# Patient Record
Sex: Female | Born: 1960 | Race: White | Hispanic: No | Marital: Married | Smoking: Current every day smoker
Health system: Southern US, Community
[De-identification: ages and names within clinical notes are randomized; demographics above are authoritative.]

## PROBLEM LIST (undated history)

## (undated) DIAGNOSIS — N951 Menopausal and female climacteric states: Secondary | ICD-10-CM

## (undated) HISTORY — PX: OTHER SURGICAL HISTORY: SHX169

## (undated) HISTORY — DX: Menopausal and female climacteric states: N95.1

---

## 2009-10-20 ENCOUNTER — Encounter: Payer: Self-pay | Admitting: Cardiology

## 2009-10-20 ENCOUNTER — Ambulatory Visit: Payer: Self-pay | Admitting: Family Medicine

## 2009-10-20 DIAGNOSIS — I499 Cardiac arrhythmia, unspecified: Secondary | ICD-10-CM | POA: Insufficient documentation

## 2009-10-20 DIAGNOSIS — I495 Sick sinus syndrome: Secondary | ICD-10-CM | POA: Insufficient documentation

## 2009-10-21 ENCOUNTER — Encounter: Payer: Self-pay | Admitting: Family Medicine

## 2009-10-23 LAB — CONVERTED CEMR LAB
ALT: 9 units/L (ref 0–35)
Albumin: 4.5 g/dL (ref 3.5–5.2)
Alkaline Phosphatase: 51 units/L (ref 39–117)
BUN: 13 mg/dL (ref 6–23)
CO2: 25 meq/L (ref 19–32)
Calcium: 8.8 mg/dL (ref 8.4–10.5)
Chloride: 106 meq/L (ref 96–112)
LDL Cholesterol: 126 mg/dL — ABNORMAL HIGH (ref 0–99)
Total Bilirubin: 0.7 mg/dL (ref 0.3–1.2)
Total CHOL/HDL Ratio: 3.3
Total Protein: 6.8 g/dL (ref 6.0–8.3)
Triglycerides: 56 mg/dL (ref <150)
VLDL: 11 mg/dL (ref 0–40)

## 2009-11-07 ENCOUNTER — Ambulatory Visit: Payer: Self-pay | Admitting: Family Medicine

## 2009-11-14 ENCOUNTER — Encounter: Admission: RE | Admit: 2009-11-14 | Discharge: 2009-11-14 | Payer: Self-pay | Admitting: Family Medicine

## 2009-11-16 DIAGNOSIS — R928 Other abnormal and inconclusive findings on diagnostic imaging of breast: Secondary | ICD-10-CM | POA: Insufficient documentation

## 2009-11-20 ENCOUNTER — Ambulatory Visit: Payer: Self-pay | Admitting: Family Medicine

## 2009-11-20 ENCOUNTER — Other Ambulatory Visit: Admission: RE | Admit: 2009-11-20 | Discharge: 2009-11-20 | Payer: Self-pay | Admitting: Family Medicine

## 2009-11-20 LAB — HM PAP SMEAR

## 2009-11-20 LAB — CONVERTED CEMR LAB: Pap Smear: NORMAL

## 2009-11-22 ENCOUNTER — Encounter: Admission: RE | Admit: 2009-11-22 | Discharge: 2009-11-22 | Payer: Self-pay | Admitting: Family Medicine

## 2009-11-22 LAB — HM MAMMOGRAPHY: HM Mammogram: NORMAL

## 2009-11-29 ENCOUNTER — Encounter: Payer: Self-pay | Admitting: Cardiology

## 2009-11-29 ENCOUNTER — Ambulatory Visit: Payer: Self-pay | Admitting: Cardiology

## 2009-11-29 DIAGNOSIS — F17200 Nicotine dependence, unspecified, uncomplicated: Secondary | ICD-10-CM

## 2009-11-29 DIAGNOSIS — R9431 Abnormal electrocardiogram [ECG] [EKG]: Secondary | ICD-10-CM | POA: Insufficient documentation

## 2009-12-14 ENCOUNTER — Encounter: Payer: Self-pay | Admitting: Cardiology

## 2009-12-14 ENCOUNTER — Ambulatory Visit: Payer: Self-pay | Admitting: Cardiology

## 2009-12-14 ENCOUNTER — Ambulatory Visit: Payer: Self-pay

## 2009-12-14 ENCOUNTER — Ambulatory Visit (HOSPITAL_COMMUNITY): Admission: RE | Admit: 2009-12-14 | Discharge: 2009-12-14 | Payer: Self-pay | Admitting: Cardiology

## 2009-12-26 ENCOUNTER — Ambulatory Visit: Payer: Self-pay | Admitting: Family Medicine

## 2009-12-26 DIAGNOSIS — IMO0001 Reserved for inherently not codable concepts without codable children: Secondary | ICD-10-CM | POA: Insufficient documentation

## 2009-12-27 ENCOUNTER — Encounter: Payer: Self-pay | Admitting: Family Medicine

## 2010-05-08 NOTE — Assessment & Plan Note (Signed)
Summary: NOV arrythmia   Vital Signs:  Patient profile:   50 year old female Height:      61 inches Weight:      114 pounds BMI:     21.62 O2 Sat:      99 % on Room air Pulse rate:   40 / minute BP sitting:   86 / 60  (left arm) Cuff size:   regular  Vitals Entered By: Payton Spark CMA (October 20, 2009 9:28 AM)  O2 Flow:  Room air CC: New to est.    Primary Care Provider:  Seymour Bars DO  CC:  New to est. .  History of Present Illness: 50 yo WF presents for NOV.  She went to [facility] years ago.  Denies any medical problems.  She has gained about 3 lbs and has been having heavier than normal periods recently.  She is having some hot flashes and nightsweats as she is just starting perimenopause.  She is a long time smoker.  Denies DOE, heart palpitations, fatigue, dizziness or presyncope.  She has never had any heart problems and takes NO RX or OTC meds.      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 Has 2 grown sons- local.  Smokes 1 ppd x 33 yrs. Denies ETOH. No exercise.  Review of Systems       no fevers/+sweats/weakness, unexplained +wt loss/gain, no change in vision, no difficulty hearing, ringing in ears, no hay fever/allergies, no CP/discomfort, no palpitations, no breast lump/nipple discharge, no cough/wheeze, no blood in stool,no  N/V/D, no nocturia, no leaking urine, + unusual vag bleeding, no vaginal/penile discharge, no muscle/joint pain, no rash, no new/changing mole, no HA, no memory loss, no anxiety, no sleep problem, no depression, no unexplained lumps, no easy bruising/bleeding, no concern with sexual function   Physical Exam  General:  alert, well-developed,  well-nourished, and well-hydrated.   Head:  normocephalic and atraumatic.   Eyes:  pupils equal, pupils round, and pupils reactive to light.   Mouth:  upper dentures poor lower dentition Neck:  no masses.   Lungs:  normal respiratory effort, no intercostal retractions, and no accessory muscle use.   Heart:  bradycardic with HR of 46 bpm, irreg rhythm with dropped beats Abdomen:  soft, non-tender, normal bowel sounds, and no distention.   Pulses:  2+ radial pulses Extremities:  no UE or LE edema Skin:  color normal.   Psych:  good eye contact, not anxious appearing, and not depressed appearing.     Impression & Recommendations:  Problem # 1:  CARDIAC ARRHYTHMIA (ICD-427.9) EKG done today for bradycardia, 40-->51 during exam.  She has PVCs with what appears to be a wandering pacer (different P waves).  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  Will check electrolytes, thyroid function today to r/o underlying cause.  Explained to pt that this needs to be addressed urgently and that if she has ANY symptoms of palpitations, dizziness, syncope, SOB, she will need to go the the ED.  Pt did not seem to clearly understand the urgency of this condition even after I explained that she  can die  from this.   Orders: T-Comprehensive Metabolic Panel [id number]) T-TSH ([id number])  Other Orders: T-Lipid Profile ([id number])  Patient Instructions: 1)  Labs today. 2)  I am faxing your abnormal EKG to [facility] cardiology to review. 3)  I will call you back today if you need to be hospitalized for this. 4)  Return for a PHYSICAL in 2 wks.  Appended Document: NOV arrythmia Pls let pt know that I spoke to Dr Rosalyn Charters nurse regarding her EKG and they feel she is OK to f/u as an outpt for this.  I will go ahead and set her up with Dr Jens Som downstairs.  Seymour Bars, D.O.  Appended Document: NOV arrythmia   Appended  Document: NOV arrythmia Pt aware of the above

## 2010-05-08 NOTE — Assessment & Plan Note (Signed)
Summary: infected bug bite   Vital Signs:  Patient profile:   50 year old female Menstrual status:  regular Height:      61 inches Weight:      113 pounds BMI:     21.43 O2 Sat:      98 % on Room air Pulse rate:   71 / minute BP sitting:   100 / 45  (left arm) Cuff size:   small  Vitals Entered By: Payton Spark CMA (December 26, 2009 3:54 PM)  O2 Flow:  Room air CC: L lower leg w/ insect bite x 4 days ago.    Primary Care Provider:  Seymour Bars DO  CC:  L lower leg w/ insect bite x 4 days ago. Marland Kitchen  History of Present Illness: 50 yo WF presents for a bug bite that she first noticed on SAT, 3 days ago.  She started stratching it.  It has been pruritis and a bit tender with rednes and swelling.  No pus or bleeding.  No fevers or chills.      Current Medications (verified): 1)  Patient Does Not Take Any Medications  Allergies (verified): No Known Drug Allergies  Past History:  Past Medical History: Reviewed history from 11/29/2009 and no changes required. MAMMOGRAM, ABNORMAL, LEFT  perimenopause  Social History: Reviewed history from 10/20/2009 and no changes required. 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  Smokes 1 ppd x 33 yrs. Denies ETOH. No exercise.  Review of Systems      See HPI  Physical Exam  General:  alert, well-developed, well-nourished, and well-hydrated.   Skin:  erythematous, localized edema and warmth with a puncture scab in the center over the lateral L calf, tender.  No drainaged.  Cleaned with betadine and alcohol.  Lifted the scab with TB syringe to obtain cx but not much drained.   Impression & Recommendations:  Problem # 1:  INSECT BITE, INFECTED (ICD-919.5) 3 days of an erythematous, painful infected insect lesion over the L calf. Will f/u cutlure results and empirically start on Minocycline x 10 days. Call if any sign of worsening after 48 hrs on abx. Clean with soap and water.  Keep covered.   Possible MRSA. Orders: T-Culture, Wound ([id number])  Complete Medication List: 1)  Patient Does Not Take Any Medications  2)  Minocycline Hcl 100 Mg Caps (Minocycline hcl) .Marland Kitchen.. 1 capsule by mouth two times a day x 10 days  Patient Instructions: 1)  Start on Minocycline 2 x a day, take with food. 2)  Clean infected bug bite with dial soap and water 2 x a day. 3)  Keep covered. 4)  Will call you with culture results this wk. 5)  Call if fevers (temp >101) or spreading redness / pain after 48 hrs. 6)  Recheck in 10 days if not completeley healed. Prescriptions: MINOCYCLINE HCL 100 MG CAPS (MINOCYCLINE HCL) 1 capsule by mouth two times a day x 10 days  #20 x 0   Entered and Authorized by:   Seymour Bars DO   Signed by:   Seymour Bars DO on 12/26/2009   Method used:   Electronically to        [facility] [phone]* (retail)       [address] [place], [place]  [address]       Ph: [phone]       Fax: [phone]   RxID:   [id number]

## 2010-05-08 NOTE — Assessment & Plan Note (Signed)
Summary: [facility]   Visit Type:  Initial Consult Primary Provider:  Seymour Bars DO  CC:  Bradycardia.  History of Present Illness: 50 year old female of abnormal electrocardiogram. Patient has no prior cardiac history. She does not have dyspnea on exertion, orthopnea, PND, pedal edema, palpitations or chest pain or history of syncope. Was recently seen for a routine physical examination and noted to be bradycardic. An electrocardiogram was performed on October 20, 2009. This showed sinus bradycardia with occasional PACs and PVCs. There were nonspecific ST changes. Because of the above we were asked to further evaluate. Note a TSH, renal function and potassium were normal.  Current Medications (verified): 1)  Patient Does Not Take Any Medications  Allergies (verified): No Known Drug Allergies  Past History:  Past Medical History: MAMMOGRAM, ABNORMAL, LEFT  perimenopause  Past Surgical History: Reviewed history from 10/20/2009 and no changes required. LCTS x 2 BTL  Family History: Reviewed history from 10/20/2009 and no changes required. mother-died with COPD, hip fx father HTN sister and 3 brothers healthy No premature CAD  Social History: Reviewed history from 10/20/2009 and no changes required. 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  Smokes 1 ppd x 33 yrs. Denies ETOH. No exercise.  Review of Systems       no fevers or chills, productive cough, hemoptysis, dysphasia, odynophagia, melena, hematochezia, dysuria, hematuria, rash, seizure activity, orthopnea, PND, pedal edema, claudication. Remaining systems are negative.   Vital Signs:  Patient profile:   50 year old female Menstrual status:  regular Height:      61 inches Weight:      112 pounds BMI:     21.24 Pulse rate:   64 / minute Pulse rhythm:   regular Resp:     18 per minute BP sitting:   89 / 51  (left arm) Cuff size:   small  Vitals Entered By: Vikki Ports  (November 29, 2009 2:05 PM)  Physical Exam  General:  Well developed/well nourished in NAD Skin warm/dry Patient not depressed No peripheral clubbing Back-normal HEENT-normal/normal eyelids Neck supple/normal carotid upstroke bilaterally; no bruits; no JVD; no thyromegaly chest - CTA/ normal expansion CV - RRR/normal S1 and S2; no murmurs, rubs or gallops;  PMI nondisplaced Abdomen -NT/ND, no HSM, no mass, + bowel sounds, no bruit 2+ femoral pulses, no bruits Ext-no edema, chords, 2+ DP; mild varicosities Neuro-grossly nonfocal     Impression & Recommendations:  Problem # 1:  ABNORMAL ELECTROCARDIOGRAM (ICD-794.31) I have reviewed the patient's electrocardiogram from October 20, 2009. This showed a marked sinus bradycardia with PACs most likely from a low atrial focus. There were also occasional PVCs and nonspecific ST changes. There is no history of syncope, palpitations or fatigue. She has no cardiac symptoms. I will schedule an echocardiogram to quantify LV function. Note a TSH and potassium were normal. If her echo shows normal LV function I do not think further cardiac workup is indicated.  Problem # 2:  TOBACCO ABUSE (ICD-305.1) Patient counseled on discontinuing for between 3-10 minutes.  Other Orders: Echocardiogram (Echo)  Patient Instructions: 1)  Your physician recommends that you schedule a follow-up appointment in: AS NEEDED PENDING TEST RESULTS 2)  Your physician has requested that you have an echocardiogram.  Echocardiography is a painless test that uses sound waves to create images of your heart. It provides your doctor with information about the size and shape of your heart and how well your heart's chambers and valves are working.  This procedure takes approximately one hour. There are no restrictions for this procedure.

## 2010-05-08 NOTE — Assessment & Plan Note (Signed)
Summary: pap only   Vital Signs:  Patient profile:   50 year old female Menstrual status:  regular Height:      61 inches Weight:      114 pounds BMI:     21.62 O2 Sat:      98 % on Room air Pulse rate:   46 / minute BP sitting:   103 / 57  (left arm) Cuff size:   regular  Vitals Entered By: Payton Spark CMA (November 20, 2009 4:00 PM)  O2 Flow:  Room air CC: Pap only   Primary Care Provider:  Seymour Bars DO  CC:  Pap only.  History of Present Illness: 50 yo WF due for pap only.  Had CPE this month but was on her period.  No hx of abnormal paps.    Allergies: No Known Drug Allergies  Physical Exam  Genitalia:  Pelvic Exam:        External: normal female genitalia without lesions or masses        Vagina: normal without lesions or masses        Cervix: normal without lesions or masses        Adnexa: normal bimanual exam without masses or fullness        Uterus: normal by palpation        Pap smear: performed   Impression & Recommendations:  Problem # 1:  SCREENING FOR MALIGNANT NEOPLASM OF THE CERVIX (ICD-V76.2) Thin prep pap done.   F/U results later this wk. She is scheduled for a f/u diagnostic mammogram.  Order was printed last wk.

## 2010-05-08 NOTE — Assessment & Plan Note (Signed)
Summary: cpe-jr   Vital Signs:  Patient profile:   50 year old female Menstrual status:  regular LMP:     11/04/2009 Height:      61 inches Weight:      113 pounds BMI:     21.43 O2 Sat:      96 % on Room air Pulse rate:   63 / minute BP sitting:   88 / 48  (left arm) Cuff size:   regular  Vitals Entered By: Payton Spark CMA (November 07, 2009 3:19 PM)  O2 Flow:  Room air CC: CPE w/out pap LMP (date): 11/04/2009     Menstrual Status regular Enter LMP: 11/04/2009   Primary Care Christina Wang:  Seymour Bars DO  CC:  CPE w/out pap.  History of Present Illness: 50 yo WF presents for CPE without pap smear.  She is G2P2002.  Perimenopausal.  REmarried.  On her period today so needs to reschedule her pap smear.  REcently had labs drawn, all normal.  Has low BP but is asymptomatic.  Physically active at work.  Smokes but is not interested in quitting.  Unsure of her last tetanus vaccine.  Pt claims to have had a colonscopy but is not sure why she had it prior to age 65.      Current Medications (verified): 1)  None  Allergies (verified): No Known Drug Allergies  Past History:  Past Medical History: smoker perimenopause  Past Surgical History: Reviewed history from 10/20/2009 and no changes required. LCTS x 2 BTL  Family History: Reviewed history from 10/20/2009 and no changes required. mother-died with COPD, hip fx father HTN sister and 3 brothers healthy  Social History: Reviewed history from 10/20/2009 and no changes required. 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  Smokes 1 ppd x 33 yrs. Denies ETOH. No exercise.  Review of Systems  The patient denies anorexia, fever, weight loss, weight gain, vision loss, decreased hearing, hoarseness, chest pain, syncope, dyspnea on exertion, peripheral edema, prolonged cough, headaches, hemoptysis, abdominal pain, melena, hematochezia, severe indigestion/heartburn, hematuria, incontinence,  genital sores, muscle weakness, suspicious skin lesions, transient blindness, difficulty walking, depression, unusual weight change, abnormal bleeding, enlarged lymph nodes, angioedema, breast masses, and testicular masses.    Physical Exam  General:  alert, well-developed, well-nourished, and well-hydrated.   Head:  normocephalic and atraumatic.   Eyes:  pupils equal, pupils round, and pupils reactive to light.   Ears:  EACs patent; TMs translucent and gray with good cone of light and bony landmarks.  Nose:  no nasal discharge.   Mouth:  dentures on top, fair dentition lower; pharynx pink and moist.   Neck:  no masses.   Breasts:  No mass, nodules, thickening, tenderness, bulging, retraction, inflamation, nipple discharge or skin changes noted.   Lungs:  Normal respiratory effort, chest expands symmetrically. Lungs are clear to auscultation, no crackles or wheezes. Heart:  Normal rate and regular rhythm. S1 and S2 normal without gallop, murmur, click, rub or other extra sounds. Abdomen:  soft, non-tender, normal bowel sounds, no distention, no masses, no guarding, no hepatomegaly, and no splenomegaly.   Msk:  no joint tenderness, no joint swelling, and no joint warmth.   Pulses:  2+ radial and pedal pulses Extremities:  no LE edema Skin:  color normal and no suspicious lesions.   Cervical Nodes:  No lymphadenopathy noted Psych:  good eye contact, not anxious appearing, and not depressed appearing.     Impression & Recommendations:  Problem # 1:  PHYSICAL EXAMINATION (ICD-V70.0) Keeping healthy checklist for women reviewed. Asymptomatic hypotension, possibly from dehydration -- works in a warehouse and sweats a lot. Discussed increasing fluids and salt intake, rehcekc in 3 mos. Tdap unknown. Colonoscopy due at 52. Update mammogram. RTC in 2 wks for pap only. Labs reviewed, normal 10-2009. MVI, smoking cessation, Calcium with D and healthy diet reviewed.  Other  Orders: T-Mammography Bilateral Screening ([id number])  Patient Instructions: 1)  Return for a nurse visit in 3 mos (Flu shot, BP recheck). 2)  Update mammogram.

## 2010-07-06 ENCOUNTER — Telehealth: Payer: Self-pay | Admitting: *Deleted

## 2010-07-06 NOTE — Telephone Encounter (Signed)
Pls let pt know that we cannot send in RX pain meds w/o an office visit.  I would advise her to take Aleve 2 tabs with breakfast and 2 tabs with dinner until she comes in to see me.  This is = to RX Naproxen.  If pain is too severe, she can go to UC.

## 2010-07-06 NOTE — Telephone Encounter (Signed)
Pt has apt next week for neck pain bc she couldn't get in this week. Pt requests Rx to start for pain before apt bc she is in pain. Please advise.

## 2010-07-06 NOTE — Telephone Encounter (Signed)
LMOM informing Pt of the above 

## 2010-07-19 ENCOUNTER — Encounter: Payer: Self-pay | Admitting: Family Medicine

## 2010-07-20 ENCOUNTER — Encounter: Payer: Self-pay | Admitting: Family Medicine

## 2010-07-20 ENCOUNTER — Ambulatory Visit (INDEPENDENT_AMBULATORY_CARE_PROVIDER_SITE_OTHER): Payer: Self-pay | Admitting: Family Medicine

## 2010-07-20 VITALS — BP 97/53 | HR 64 | Ht 61.0 in | Wt 115.0 lb

## 2010-07-20 DIAGNOSIS — G5600 Carpal tunnel syndrome, unspecified upper limb: Secondary | ICD-10-CM

## 2010-07-20 DIAGNOSIS — M542 Cervicalgia: Secondary | ICD-10-CM | POA: Insufficient documentation

## 2010-07-20 MED ORDER — MELOXICAM 15 MG PO TABS: 15.0000 mg | ORAL_TABLET | Freq: Every day | ORAL | Status: DC

## 2010-07-20 NOTE — Assessment & Plan Note (Signed)
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  Will do home PT (h/o given), heat and daily meloxicam with dinner to start.  If not imprving, will add PT

## 2010-07-20 NOTE — Progress Notes (Signed)
  Subjective:    Patient ID: Christina Wang, female    DOB: 10/11/1960, 50 y.o.   MRN: [id number]  HPI  50 yo WF presents for pain in her neck that is worse on the L side, worse at the end of the day and worse at the end of the week.  It started bothering her a few months ago.  She does repetitive line work.  She is having more limitation in her neck ROM with flexion.  She tried ibuprofen which helped some.  Her neck hurts when she tries to get comfortable at night.  No radiation into her arms but has some tingling in her hands.  She has never had imaging done and denies any hx of whiplash injury.    BP 97/53  Pulse 64  Ht 5\' 1"  (1.549 m)  Wt 115 lb (52.164 kg)  BMI 21.73 kg/m2  SpO2 98%  LMP 06/07/2010    Review of Systems  Musculoskeletal: Positive for myalgias. Negative for back pain.  Neurological: Positive for numbness (nubmness in both hands, chronic). Negative for tremors, weakness and headaches.       Objective:   Physical Exam  Constitutional: She appears well-developed and well-nourished.  HENT:  Head: Normocephalic and atraumatic.  Neck: Normal range of motion. Neck supple. No thyromegaly present.       Tender, tight trapezious muscle esp over the L lateral neck  Cardiovascular: Normal rate, regular rhythm and normal heart sounds.   No murmur heard. Pulmonary/Chest: Effort normal and breath sounds normal.  Musculoskeletal: She exhibits no edema.       Grip + 5/5  Skin: Skin is warm and dry.  Psychiatric: She has a normal mood and affect.          Assessment & Plan:

## 2010-07-20 NOTE — Assessment & Plan Note (Signed)
Likely, chronic.  Started way before neck pain started.  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  Already on meloxicam.  No loss of strength.

## 2010-07-20 NOTE — Patient Instructions (Signed)
Referral made to ortho downstairs for probable bilateral carpal tunnel.  After work, use a heating pad (hot shower or shot towel) for 10 min followed by neck stretches and take 1 tab of meloxicam with dinner.

## 2010-08-09 ENCOUNTER — Telehealth: Payer: Self-pay | Admitting: Family Medicine

## 2010-08-09 NOTE — Telephone Encounter (Signed)
Pt called and needs an order for nerve test to be done at [facility].  Originally saw Dr. Orlan Leavens last week.  To go on 08-14-10 for test. Plan:  Routed to Dr. Arlice Colt, LPN [facility]

## 2010-10-22 ENCOUNTER — Other Ambulatory Visit: Payer: Self-pay | Admitting: Family Medicine

## 2010-10-22 DIAGNOSIS — Z1231 Encounter for screening mammogram for malignant neoplasm of breast: Secondary | ICD-10-CM

## 2010-11-26 ENCOUNTER — Ambulatory Visit: Payer: Commercial Indemnity

## 2010-11-27 ENCOUNTER — Ambulatory Visit
Admission: RE | Admit: 2010-11-27 | Discharge: 2010-11-27 | Disposition: A | Discharge status: Home / Self Care | Payer: Commercial Indemnity | Source: Ambulatory Visit | Attending: Family Medicine | Admitting: Family Medicine

## 2010-11-27 DIAGNOSIS — Z1231 Encounter for screening mammogram for malignant neoplasm of breast: Secondary | ICD-10-CM

## 2011-04-11 ENCOUNTER — Ambulatory Visit (INDEPENDENT_AMBULATORY_CARE_PROVIDER_SITE_OTHER): Payer: Commercial Indemnity | Admitting: Physician Assistant

## 2011-04-11 ENCOUNTER — Encounter: Payer: Self-pay | Admitting: Physician Assistant

## 2011-04-11 ENCOUNTER — Other Ambulatory Visit: Payer: Self-pay | Admitting: Physician Assistant

## 2011-04-11 ENCOUNTER — Other Ambulatory Visit: Payer: Self-pay | Admitting: *Deleted

## 2011-04-11 ENCOUNTER — Ambulatory Visit: Payer: Commercial Indemnity | Admitting: Family Medicine

## 2011-04-11 VITALS — BP 107/61 | HR 80 | Temp 97.9°F | Ht 60.0 in | Wt 111.0 lb

## 2011-04-11 DIAGNOSIS — L03319 Cellulitis of trunk, unspecified: Secondary | ICD-10-CM

## 2011-04-11 DIAGNOSIS — L02212 Cutaneous abscess of back [any part, except buttock]: Secondary | ICD-10-CM

## 2011-04-11 MED ORDER — MELOXICAM 15 MG PO TABS: 15.0000 mg | ORAL_TABLET | Freq: Every day | ORAL | Status: AC

## 2011-04-11 NOTE — Patient Instructions (Addendum)
Gave handout. Follow up in 1 week. Instructed patient when receive culture antibiotic might be prescribed. Symptomatic Care.  Abscess An abscess (boil or furuncle) is an infected area that contains a collection of pus.  SYMPTOMS Signs and symptoms of an abscess include pain, tenderness, redness, or hardness. You may feel a moveable soft area under your skin. An abscess can occur anywhere in the body.  TREATMENT  A surgical cut (incision) may be made over your abscess to drain the pus. Gauze may be packed into the space or a drain may be looped through the abscess cavity (pocket). This provides a drain that will allow the cavity to heal from the inside outwards. The abscess may be painful for a few days, but should feel much better if it was drained.  Your abscess, if seen early, may not have localized and may not have been drained. If not, another appointment may be required if it does not get better on its own or with medications. HOME CARE INSTRUCTIONS   Only take over-the-counter or prescription medicines for pain, discomfort, or fever as directed by your caregiver.   Take your antibiotics as directed if they were prescribed. Finish them even if you start to feel better.   Keep the skin and clothes clean around your abscess.   If the abscess was drained, you will need to use gauze dressing to collect any draining pus. Dressings will typically need to be changed 3 or more times a day.   The infection may spread by skin contact with others. Avoid skin contact as much as possible.   Practice good hygiene. This includes regular hand washing, cover any draining skin lesions, and do not share personal care items.   If you participate in sports, do not share athletic equipment, towels, whirlpools, or personal care items. Shower after every practice or tournament.   If a draining area cannot be adequately covered:   Do not participate in sports.   Children should not participate in day care  until the wound has healed or drainage stops.   If your caregiver has given you a follow-up appointment, it is very important to keep that appointment. Not keeping the appointment could result in a much worse infection, chronic or permanent injury, pain, and disability. If there is any problem keeping the appointment, you must call back to this facility for assistance.  SEEK MEDICAL CARE IF:   You develop increased pain, swelling, redness, drainage, or bleeding in the wound site.   You develop signs of generalized infection including muscle aches, chills, fever, or a general ill feeling.   You have an oral temperature above 102 F (38.9 C).  MAKE SURE YOU:   Understand these instructions.   Will watch your condition.   Will get help right away if you are not doing well or get worse.  Document Released: 01/02/2005 Document Revised: 12/05/2010 Document Reviewed: 10/27/2007 [facility] Patient Information 2012 [place], [place].

## 2011-04-11 NOTE — Progress Notes (Signed)
  Subjective:    Patient ID: Christina Wang, female    DOB: [birth year], 51 y.o.   MRN: [id number]  HPI Patient has a history of boils on other areas of body. She has not had one in a couple of years. Patient reports she noticed a red bump on her back about 1 week ago. It has gradually grown until it has become painful and she can't lay on that side. She denies any fever of systemic symptoms. She reports it has drained some but not a lot.   Review of Systems  Constitutional: Negative for fever, chills, diaphoresis, activity change, appetite change, fatigue and unexpected weight change.  Skin: Positive for wound.       Objective:   Physical Exam  Constitutional: She is oriented to person, place, and time. She appears well-developed and well-nourished. No distress.  HENT:  Head: Normocephalic and atraumatic.  Neurological: She is alert and oriented to person, place, and time.  Skin: She is not diaphoretic. There is erythema.       4 cm abscess with area of erythema and center scab with some purulent drainage on the right upper back on the shoulder blade  Psychiatric: She has a normal mood and affect. Her behavior is normal.          Assessment & Plan:  Abscess of the upper right back- Gave handout on symptomatic care. Follow-up in 1 week. Culture obtained.  Incision and Drainage Procedure Note  Pre-operative Diagnosis: abscess   Post-operative Diagnosis: same  Indications: infection  Anesthesia: 1% plain lidocaine  Procedure Details  The procedure, risks and complications have been discussed in detail (including, but not limited to airway compromise, infection, bleeding) with the patient, and the patient has signed consent to the procedure.  The skin was sterilely prepped and draped over the affected area in the usual fashion. After adequate local anesthesia, I&D with a #11 blade was performed on the right upper back on top of shoulder blade. Purulent drainage: present The  patient was observed until stable.  Findings: After incision made purulent pus was observed and obtain for culture. Pus, blood, and inflammatory tissue was removed. Abscess packed with directions on home care.  EBL: .5 cc's  Drains: none  Condition: Tolerated procedure well   Complications: none.

## 2011-04-17 LAB — WOUND CULTURE: Gram Stain: NONE SEEN

## 2011-04-19 ENCOUNTER — Encounter: Payer: Self-pay | Admitting: Physician Assistant

## 2011-04-19 ENCOUNTER — Ambulatory Visit (INDEPENDENT_AMBULATORY_CARE_PROVIDER_SITE_OTHER): Payer: Commercial Indemnity | Admitting: Physician Assistant

## 2011-04-19 VITALS — BP 97/57 | HR 71 | Ht 60.0 in | Wt 112.0 lb

## 2011-04-19 DIAGNOSIS — L02212 Cutaneous abscess of back [any part, except buttock]: Secondary | ICD-10-CM

## 2011-04-19 DIAGNOSIS — L02219 Cutaneous abscess of trunk, unspecified: Secondary | ICD-10-CM

## 2011-04-19 MED ORDER — CLINDAMYCIN HCL 150 MG PO CAPS: 300.0000 mg | ORAL_CAPSULE | Freq: Three times a day (TID) | ORAL | Status: AC

## 2011-04-19 NOTE — Progress Notes (Signed)
  Subjective:    Patient ID: Christina Wang, female    DOB: 06/02/1960, 52 y.o.   MRN: [id number]  HPI Patient reports all of the packing has been taken out. Abscess remains to drain and she changes bandages 3-4 times a day. Abscess is much less tender. Denies any fever, chills, or nausea and vomiting.     Review of Systems     Objective:   Physical Exam  Skin:       Abscess of the right upper back has decreased in size. It still has white/yellow drainage. Area of erythema is minimal, 1mm around abscess.           Assessment & Plan:  Abscess of upper right back- Redressed wound today. Would appears to be healing but still has a modest amount of purulent drainage. Continue to change dressing 3-4 times a day or as needed. Clindamycin was prescribed due to culture testing positive for MRSA. Follow up if not resolved in 2 weeks.

## 2011-04-19 NOTE — Patient Instructions (Signed)
Start Clindamycin for MRSA. Continue to change dressing 3-4 times a day or as needed due to drainage. Follow-up if not resolved in 2 weeks.

## 2011-10-23 ENCOUNTER — Other Ambulatory Visit: Payer: Self-pay | Admitting: Family Medicine

## 2011-10-23 DIAGNOSIS — Z1231 Encounter for screening mammogram for malignant neoplasm of breast: Secondary | ICD-10-CM

## 2011-12-03 ENCOUNTER — Ambulatory Visit (INDEPENDENT_AMBULATORY_CARE_PROVIDER_SITE_OTHER): Payer: Commercial Indemnity

## 2011-12-03 DIAGNOSIS — Z1231 Encounter for screening mammogram for malignant neoplasm of breast: Secondary | ICD-10-CM

## 2012-05-19 ENCOUNTER — Ambulatory Visit: Payer: Commercial Indemnity

## 2012-05-27 ENCOUNTER — Ambulatory Visit (INDEPENDENT_AMBULATORY_CARE_PROVIDER_SITE_OTHER): Payer: Commercial Indemnity | Admitting: Family Medicine

## 2012-05-27 ENCOUNTER — Encounter: Payer: Self-pay | Admitting: Family Medicine

## 2012-05-27 VITALS — BP 98/60 | Temp 97.7°F | Ht 60.0 in | Wt 118.0 lb

## 2012-05-27 DIAGNOSIS — B9689 Other specified bacterial agents as the cause of diseases classified elsewhere: Secondary | ICD-10-CM

## 2012-05-27 DIAGNOSIS — A499 Bacterial infection, unspecified: Secondary | ICD-10-CM

## 2012-05-27 MED ORDER — AZITHROMYCIN 250 MG PO TABS: ORAL_TABLET | ORAL | Status: AC

## 2012-05-27 NOTE — Progress Notes (Signed)
CC: Christina Wang is a 52 y.o. female is here for Nasal Congestion   Subjective: HPI:  Patient complains of a cough that started 8 days ago. Initially productive and mild was gone for 2 days and then came back suddenly moderate in severity. It has been persistent and unchanging since. It is present all hours of the day but seems to be worse in the evening. It often interferes with her sleep. It is described as productive of brown sputum. No blood in the sputum. She complains of chest congestion but no pain per se, and denies exertional chest pain. She had subjective fevers last night. No interventions as of yet. Associated with clear nasal discharge with pressure in the forehead that is worse when leaning forward but this is mild at the most. She denies chills, confusion, motor sensory disturbances, sore throat, hearing loss, shortness of breath, wheezing, confusion, dysphagia.    Review Of Systems Outlined In HPI  Past Medical History  Diagnosis Date  . Perimenopause      Family History  Problem Relation Age of Onset  . COPD Mother   . Hip fracture Mother   . Hypertension Father   . Coronary artery disease Neg Hx      History  Substance Use Topics  . Smoking status: Current Every Day Smoker -- 1.00 packs/day for 33 years  . Smokeless tobacco: Not on file  . Alcohol Use: No     Objective: Filed Vitals:   05/27/12 1531  BP: 98/60  Temp: 97.7 F (36.5 C)    General: Alert and Oriented, No Acute Distress HEENT: Pupils equal, round, reactive to light. Conjunctivae clear.  External ears unremarkable, canals clear with intact TMs with appropriate landmarks.  Middle ear appears open without effusion. Pink inferior turbinates.  Moist mucous membranes, pharynx without inflammation nor lesions.  Neck supple without palpable lymphadenopathy nor abnormal masses. Lungs: Moderate central rhonchi without wheezing nor rales. No signs of consolidation. Comfortable work of  breathing. Cardiac: Regular rate and rhythm. Normal S1/S2.  No murmurs, rubs, nor gallops.   Extremities: No peripheral edema.  Strong peripheral pulses.  Mental Status: No depression, anxiety, nor agitation. Skin: Warm and dry.  Assessment & Plan: Christina Wang was seen today for nasal congestion.  Diagnoses and associated orders for this visit:  Acute bacterial bronchitis - azithromycin (ZITHROMAX) 250 MG tablet; Take two tabs at once on day 1, then one tab daily on days 2-5.    Given current tobacco use and persistence of symptoms we'll start azithromycin. Discussed use of DayQuil and or NyQuil at appropriate times a day for symptoms control.Signs and symptoms requring emergent/urgent reevaluation were discussed with the patient.  Return in about 4 weeks (around 06/24/2012), or if symptoms worsen or fail to improve.

## 2012-10-26 ENCOUNTER — Other Ambulatory Visit: Payer: Self-pay | Admitting: Family Medicine

## 2012-10-26 DIAGNOSIS — Z139 Encounter for screening, unspecified: Secondary | ICD-10-CM

## 2012-12-03 ENCOUNTER — Ambulatory Visit (INDEPENDENT_AMBULATORY_CARE_PROVIDER_SITE_OTHER): Payer: Commercial Indemnity

## 2012-12-03 DIAGNOSIS — R928 Other abnormal and inconclusive findings on diagnostic imaging of breast: Secondary | ICD-10-CM

## 2012-12-03 DIAGNOSIS — Z1231 Encounter for screening mammogram for malignant neoplasm of breast: Secondary | ICD-10-CM

## 2012-12-03 DIAGNOSIS — Z139 Encounter for screening, unspecified: Secondary | ICD-10-CM

## 2012-12-08 ENCOUNTER — Other Ambulatory Visit: Payer: Self-pay | Admitting: Family Medicine

## 2012-12-08 DIAGNOSIS — R928 Other abnormal and inconclusive findings on diagnostic imaging of breast: Secondary | ICD-10-CM

## 2012-12-25 ENCOUNTER — Ambulatory Visit
Admission: RE | Admit: 2012-12-25 | Discharge: 2012-12-25 | Disposition: A | Discharge status: Home / Self Care | Payer: Commercial Indemnity | Source: Ambulatory Visit | Attending: Family Medicine | Admitting: Family Medicine

## 2012-12-25 DIAGNOSIS — R928 Other abnormal and inconclusive findings on diagnostic imaging of breast: Secondary | ICD-10-CM

## 2013-11-22 ENCOUNTER — Other Ambulatory Visit: Payer: Self-pay | Admitting: Family Medicine

## 2013-11-22 DIAGNOSIS — Z1231 Encounter for screening mammogram for malignant neoplasm of breast: Secondary | ICD-10-CM

## 2013-12-07 ENCOUNTER — Ambulatory Visit (INDEPENDENT_AMBULATORY_CARE_PROVIDER_SITE_OTHER): Payer: Commercial Indemnity

## 2013-12-07 DIAGNOSIS — Z1231 Encounter for screening mammogram for malignant neoplasm of breast: Secondary | ICD-10-CM

## 2014-11-08 ENCOUNTER — Other Ambulatory Visit: Payer: Self-pay | Admitting: Family Medicine

## 2014-11-08 DIAGNOSIS — Z1231 Encounter for screening mammogram for malignant neoplasm of breast: Secondary | ICD-10-CM

## 2014-12-13 ENCOUNTER — Ambulatory Visit (INDEPENDENT_AMBULATORY_CARE_PROVIDER_SITE_OTHER): Payer: Commercial Indemnity

## 2014-12-13 DIAGNOSIS — R928 Other abnormal and inconclusive findings on diagnostic imaging of breast: Secondary | ICD-10-CM

## 2014-12-13 DIAGNOSIS — Z1231 Encounter for screening mammogram for malignant neoplasm of breast: Secondary | ICD-10-CM

## 2014-12-14 ENCOUNTER — Other Ambulatory Visit: Payer: Self-pay | Admitting: Family Medicine

## 2014-12-14 ENCOUNTER — Ambulatory Visit: Payer: Commercial Indemnity

## 2014-12-14 DIAGNOSIS — R928 Other abnormal and inconclusive findings on diagnostic imaging of breast: Secondary | ICD-10-CM

## 2014-12-23 ENCOUNTER — Ambulatory Visit
Admission: RE | Admit: 2014-12-23 | Discharge: 2014-12-23 | Disposition: A | Discharge status: Home / Self Care | Payer: Managed Care, Other (non HMO) | Source: Ambulatory Visit | Attending: Family Medicine | Admitting: Family Medicine

## 2014-12-23 DIAGNOSIS — R928 Other abnormal and inconclusive findings on diagnostic imaging of breast: Secondary | ICD-10-CM

## 2015-11-20 ENCOUNTER — Other Ambulatory Visit: Payer: Self-pay | Admitting: Family Medicine

## 2015-11-20 DIAGNOSIS — Z1231 Encounter for screening mammogram for malignant neoplasm of breast: Secondary | ICD-10-CM

## 2015-12-27 ENCOUNTER — Ambulatory Visit (INDEPENDENT_AMBULATORY_CARE_PROVIDER_SITE_OTHER): Payer: Managed Care, Other (non HMO)

## 2015-12-27 DIAGNOSIS — Z1231 Encounter for screening mammogram for malignant neoplasm of breast: Secondary | ICD-10-CM

## 2016-01-01 ENCOUNTER — Other Ambulatory Visit: Payer: Self-pay | Admitting: Family Medicine

## 2016-01-01 DIAGNOSIS — R928 Other abnormal and inconclusive findings on diagnostic imaging of breast: Secondary | ICD-10-CM

## 2016-01-03 ENCOUNTER — Telehealth: Payer: Self-pay | Admitting: Family Medicine

## 2016-01-03 NOTE — Telephone Encounter (Signed)
Pleas tell her I apologize. That was my fault.  She really does need further evaluation.

## 2016-01-03 NOTE — Telephone Encounter (Signed)
Pt advised, she has scheduled with the breast center.

## 2016-01-03 NOTE — Telephone Encounter (Signed)
Received call from [facility]- Breast center. They attempted to contact Pt to schedule US and diagnostic mammo, Pt declined stating she was advised last mammo was normal. Per PCP note, that is what Pt was advised. The breast center would like PCP to review latest mammo again for recommendation.

## 2016-01-12 ENCOUNTER — Other Ambulatory Visit: Payer: Managed Care, Other (non HMO)

## 2016-01-22 ENCOUNTER — Encounter: Payer: Self-pay | Admitting: Family Medicine

## 2016-01-22 ENCOUNTER — Ambulatory Visit
Admission: RE | Admit: 2016-01-22 | Discharge: 2016-01-22 | Disposition: A | Discharge status: Home / Self Care | Payer: Managed Care, Other (non HMO) | Source: Ambulatory Visit | Attending: Family Medicine | Admitting: Family Medicine

## 2016-01-22 ENCOUNTER — Ambulatory Visit (INDEPENDENT_AMBULATORY_CARE_PROVIDER_SITE_OTHER): Payer: Managed Care, Other (non HMO) | Admitting: Family Medicine

## 2016-01-22 VITALS — BP 105/54 | HR 64 | Ht 60.0 in | Wt 124.0 lb

## 2016-01-22 DIAGNOSIS — N200 Calculus of kidney: Secondary | ICD-10-CM

## 2016-01-22 DIAGNOSIS — Z23 Encounter for immunization: Secondary | ICD-10-CM

## 2016-01-22 DIAGNOSIS — K5904 Chronic idiopathic constipation: Secondary | ICD-10-CM | POA: Diagnosis not present

## 2016-01-22 DIAGNOSIS — R928 Other abnormal and inconclusive findings on diagnostic imaging of breast: Secondary | ICD-10-CM

## 2016-01-22 NOTE — Progress Notes (Signed)
Subjective:    CC: ED F/U  HPI:  55 yo Female started 2 accidents left leg pain. She went to the emergency room about 10 days ago on October 6. He had a CT scan that did show a 2 mm left UVJ calculus associated with some mild left hydroureter ureter nephrosis and hydronephrosis. She also had some small 5 mm hypodensities in the right hepatic lobe of the liver most consistent with cysts. She also had diffuse colonic fecal retention. No sign of bowel obstruction. She was given morphine, Zofran and Toradol. The toradol help ed the most.  Had 2 more sharp episodes of pain that day but none since then.  She says she does have chronic constipation.   Past medical history, Surgical history, Family history not pertinant except as noted below, Social history, Allergies, and medications have been entered into the medical record, reviewed, and corrections made.   Review of Systems: No fevers, chills, night sweats, weight loss, chest pain, or shortness of breath.   Objective:    General: Well Developed, well nourished, and in no acute distress.  Neuro: Alert and oriented x3, extra-ocular muscles intact, sensation grossly intact.  HEENT: Normocephalic, atraumatic  Skin: Warm and dry, no rashes. Cardiac: Regular rate and rhythm, no murmurs rubs or gallops, no lower extremity edema.  Respiratory: Clear to auscultation bilaterally. Not using accessory muscles, speaking in full sentences. Abd: Soft, nontender. Normal bowel sounds.   Impression and Recommendations:   Left renal stone - right now she is pain-free and symptom-free. No blood in the urine. If symptoms recur please call us back and we'll get her in with urology. For right now she is doing fantastic.  Constipation - recommend a trial of MiraLAX.  She does have her follow-up for repeat imaging for abnormal left mammogram.

## 2016-01-22 NOTE — Patient Instructions (Signed)
Recommend a trial of Miralax.  I capful mixed with 8 oz of water for 14 days. Can stop if start to get diarrhea.

## 2016-02-12 ENCOUNTER — Encounter: Payer: Self-pay | Admitting: Family Medicine

## 2016-02-12 ENCOUNTER — Other Ambulatory Visit (HOSPITAL_COMMUNITY)
Admission: RE | Admit: 2016-02-12 | Discharge: 2016-02-12 | Disposition: A | Discharge status: Home / Self Care | Payer: Managed Care, Other (non HMO) | Source: Ambulatory Visit | Attending: Family Medicine | Admitting: Family Medicine

## 2016-02-12 ENCOUNTER — Ambulatory Visit (INDEPENDENT_AMBULATORY_CARE_PROVIDER_SITE_OTHER): Payer: Managed Care, Other (non HMO) | Admitting: Family Medicine

## 2016-02-12 VITALS — BP 111/59 | HR 59 | Ht 60.0 in | Wt 123.0 lb

## 2016-02-12 DIAGNOSIS — Z124 Encounter for screening for malignant neoplasm of cervix: Secondary | ICD-10-CM

## 2016-02-12 DIAGNOSIS — Z1151 Encounter for screening for human papillomavirus (HPV): Secondary | ICD-10-CM | POA: Insufficient documentation

## 2016-02-12 DIAGNOSIS — Z01419 Encounter for gynecological examination (general) (routine) without abnormal findings: Secondary | ICD-10-CM | POA: Diagnosis present

## 2016-02-12 NOTE — Progress Notes (Signed)
   Subjective:    Patient ID: Christina Wang, female    DOB: [birth year], 55 y.o.   MRN: [id number]  HPI 55 year old female who is here today for Pap smear only. She declines to have a full physical today. She has not had any complaints pelvic problems or discharge. She has one sexual partner, her husband.     Review of Systems  BP (!) 111/59   Pulse (!) 59   Ht 5' (1.524 m)   Wt 123 lb (55.8 kg)   SpO2 100%   BMI 24.02 kg/m     No Known Allergies  Past Medical History:  Diagnosis Date  . Perimenopause     Past Surgical History:  Procedure Laterality Date  . BTL    . LCTS     X 2    Social History   Social History  . Marital status: Married    Spouse name: N/A  . Number of children: N/A  . Years of education: N/A   Occupational History  . Not on file.   Social History Main Topics  . Smoking status: Current Every Day Smoker    Packs/day: 1.00    Years: 33.00  . Smokeless tobacco: Not on file  . Alcohol use No  . Drug use: Unknown  . Sexual activity: Not on file   Other Topics Concern  . Not on file   Social History Narrative  . No narrative on file    Family History  Problem Relation Age of Onset  . COPD Mother   . Hip fracture Mother   . Hypertension Father   . Coronary artery disease Neg Hx     No outpatient encounter prescriptions on file as of 02/12/2016.   No facility-administered encounter medications on file as of 02/12/2016.           Objective:   Physical Exam  Constitutional: She is oriented to person, place, and time. She appears well-developed and well-nourished.  HENT:  Head: Normocephalic and atraumatic.  Genitourinary: Vagina normal and uterus normal. There is no rash on the right labia. There is no rash on the left labia. Cervix exhibits no motion tenderness, no discharge and no friability. Right adnexum displays no mass, no tenderness and no fullness. Left adnexum displays no mass, no tenderness and no fullness.   Neurological: She is alert and oriented to person, place, and time.  Skin: Skin is warm.  Psychiatric: She has a normal mood and affect. Her behavior is normal.        Assessment & Plan:  Pap smear performed.  Will call with report in one week. Did encourage her to schedule a physical at some point.

## 2016-02-14 LAB — CYTOLOGY - PAP
Diagnosis: NEGATIVE
HPV: NOT DETECTED

## 2016-11-04 LAB — HM MAMMOGRAPHY

## 2016-11-08 ENCOUNTER — Encounter: Payer: Self-pay | Admitting: Family Medicine

## 2016-12-05 ENCOUNTER — Telehealth: Payer: Self-pay | Admitting: Family Medicine

## 2016-12-05 DIAGNOSIS — N6489 Other specified disorders of breast: Secondary | ICD-10-CM

## 2016-12-05 NOTE — Telephone Encounter (Signed)
Pt called and stated according to her mammogram results they are wanting her to have a diagnostic mammogram in 6 months (Jan. 26th, 2019)  of left breast including an exaggerated cranlocaudal view.She stated that they told her that her pcp needs to put an order in for this for [facility].. Thanks

## 2016-12-11 NOTE — Telephone Encounter (Signed)
Orders faxed to [facility].

## 2016-12-11 NOTE — Telephone Encounter (Signed)
Orders placed  Pt advised

## 2017-03-20 ENCOUNTER — Encounter: Payer: Self-pay | Admitting: Family Medicine

## 2017-03-20 ENCOUNTER — Ambulatory Visit (INDEPENDENT_AMBULATORY_CARE_PROVIDER_SITE_OTHER): Payer: Managed Care, Other (non HMO)

## 2017-03-20 ENCOUNTER — Ambulatory Visit (INDEPENDENT_AMBULATORY_CARE_PROVIDER_SITE_OTHER): Payer: Managed Care, Other (non HMO) | Admitting: Family Medicine

## 2017-03-20 DIAGNOSIS — M6530 Trigger finger, unspecified finger: Secondary | ICD-10-CM | POA: Insufficient documentation

## 2017-03-20 DIAGNOSIS — M778 Other enthesopathies, not elsewhere classified: Secondary | ICD-10-CM | POA: Insufficient documentation

## 2017-03-20 DIAGNOSIS — M79642 Pain in left hand: Secondary | ICD-10-CM

## 2017-03-20 MED ORDER — PREDNISONE 5 MG (48) PO TBPK: ORAL_TABLET | ORAL | 0 refills | Status: DC

## 2017-03-20 MED ORDER — DICLOFENAC SODIUM 1 % TD GEL: 2.0000 g | Freq: Four times a day (QID) | TRANSDERMAL | 11 refills | Status: DC

## 2017-03-20 NOTE — Patient Instructions (Signed)
Thank you for coming in today. Apply the diclofenac gel to the hand 4x daily.  If you cannot get it then you can use over the counter aspercream.  Take prednisone daily for 12 days Get xray and labs today.  Recheck if not better.    Rheumatoid Arthritis Rheumatoid arthritis (RA) is a long-term (chronic) disease. RA causes inflammation in your joints. Your joints may feel painful, stiff, swollen, warm, or tender. RA may start slowly. Usually, it affects the small joints of the hands and feet. It can also affect other parts of the body, even the heart, eyes, or lungs. Symptoms of RA often come and go. Sometimes, symptoms get worse for a while. These are called flares. There is no cure for RA, but your doctor will work with you to find the best treatment option for you. This will depend on how the disease is changing in your body. Follow these instructions at home:  Take over-the-counter and prescription medicines only as told by your doctor. Your doctor may change (adjust) your medicines every 3 months.  Start an exercise program as told by your doctor.  Rest when you have a flare.  Return to your normal activities as told by your doctor. Ask your doctor what activities are safe for you.  Keep all follow-up visits as told by your doctor. This is important. Contact a doctor if:  You have a flare.  You have a fever.  You have problems (side effects) because of your medicines. Get help right away if:  You have chest pain.  You have trouble breathing.  You have a hot, painful joint all of a sudden, and it is worse than your usual joint aches. This information is not intended to replace advice given to you by your health care provider. Make sure you discuss any questions you have with your health care provider. Document Released: 06/17/2011 Document Revised: 08/31/2015 Document Reviewed: 01/05/2015 Elsevier Interactive Patient Education  [employer]2018 [employer] Inc[employer]

## 2017-03-20 NOTE — Progress Notes (Signed)
Christina Wang is a 56 y.o. female who presents to [facility] today for left dorsal hand pain and swelling.  Ladonne notes a several day history of pain and swelling left dorsal hand overlying the second and third metacarpal phalangeal joints.  She denies any injury that could explain the pain.  She notes this is associated with stiffness.  She denies any fevers or chills.  She is tried ibuprofen which helps a bit.  She denies a family history for rheumatoid arthritis.  She is right-hand dominant.  No significant change in activity.   Past Medical History:  Diagnosis Date  . Perimenopause    Past Surgical History:  Procedure Laterality Date  . BTL    . LCTS     X 2   Social History   Tobacco Use  . Smoking status: Current Every Day Smoker    Packs/day: 1.00    Years: 33.00    Pack years: 33.00  . Smokeless tobacco: Never Used  Substance Use Topics  . Alcohol use: No     ROS:  As above   Medications: Current Outpatient Medications  Medication Sig Dispense Refill  . diclofenac sodium (VOLTAREN) 1 % GEL Apply 2 g topically 4 (four) times daily. To affected joint. 100 g 11  . predniSONE (STERAPRED UNI-PAK 48 TAB) 5 MG (48) TBPK tablet 12 day dosepack po 48 tablet 0   No current facility-administered medications for this visit.    No Known Allergies   Exam:  BP 121/67   Pulse 69   Ht _0  (1.575 m)   Wt 115 lb (52.2 kg)   BMI 21.03 kg/m  General: Well Developed, well nourished, and in no acute distress.  Neuro/Psych: Alert and oriented x3, extra-ocular muscles intact, able to move all 4 extremities, sensation grossly intact. Skin: Warm and dry, no rashes noted.  Respiratory: Not using accessory muscles, speaking in full sentences, trachea midline.  Cardiovascular: Pulses palpable, no extremity edema. Abdomen: Does not appear distended. MSK: Left hand is slightly swollen and tender overlying the second and third MCPs.   Slightly decreased flexion range of motion.  Pulses capillary refill and sensation are intact distally.    No results found for this or any previous visit (from the past 48 hour(s)). No results found.    Assessment and Plan: 56 y.o. female with left dorsal hand pain and swelling with unclear etiology.  Plan for workup including hand x-ray CBC CCP ESR ANA and rheumatoid factor.  Empiric treatment with prednisone and diclofenac gel.  Recheck as needed.    Orders Placed This Encounter  Procedures  . DG Hand Complete Left    Standing Status:   Future    Standing Expiration Date:   05/21/2018    Order Specific Question:   Reason for Exam (SYMPTOM  OR DIAGNOSIS REQUIRED)    Answer:   eval pain and swelling and MCP 2 and 3. No injury    Order Specific Question:   Is patient pregnant?    Answer:   No    Order Specific Question:   Preferred imaging location?    Answer:   [facility]    Order Specific Question:   Radiology Contrast Protocol - do NOT remove file path    Answer:   [email or web address]  . CBC with Differential/Platelet  . Cyclic citrul peptide antibody, IgG  . Sedimentation rate  . ANA  . Rheumatoid factor   Meds ordered this encounter  Medications  .  diclofenac sodium (VOLTAREN) 1 % GEL    Sig: Apply 2 g topically 4 (four) times daily. To affected joint.    Dispense:  100 g    Refill:  11  . predniSONE (STERAPRED UNI-PAK 48 TAB) 5 MG (48) TBPK tablet    Sig: 12 day dosepack po    Dispense:  48 tablet    Refill:  0    Discussed warning signs or symptoms. Please see discharge instructions. Patient expresses understanding.

## 2017-03-21 LAB — CBC WITH DIFFERENTIAL/PLATELET
Basophils Absolute: 38 cells/uL (ref 0–200)
Basophils Relative: 0.6 %
EOS ABS: 90 {cells}/uL (ref 15–500)
Eosinophils Relative: 1.4 %
HCT: 35.3 % (ref 35.0–45.0)
HEMOGLOBIN: 12.1 g/dL (ref 11.7–15.5)
LYMPHS ABS: 2470 {cells}/uL (ref 850–3900)
MCH: 29.9 pg (ref 27.0–33.0)
MCHC: 34.3 g/dL (ref 32.0–36.0)
MCV: 87.2 fL (ref 80.0–100.0)
MPV: 9.5 fL (ref 7.5–12.5)
Monocytes Relative: 6.5 %
NEUTROS ABS: 3386 {cells}/uL (ref 1500–7800)
Neutrophils Relative %: 52.9 %
Platelets: 236 10*3/uL (ref 140–400)
RBC: 4.05 10*6/uL (ref 3.80–5.10)
RDW: 13 % (ref 11.0–15.0)
Total Lymphocyte: 38.6 %
WBC: 6.4 10*3/uL (ref 3.8–10.8)
WBCMIX: 416 {cells}/uL (ref 200–950)

## 2017-03-21 LAB — SEDIMENTATION RATE: Sed Rate: 6 mm/h (ref 0–30)

## 2017-03-21 LAB — ANA: Anti Nuclear Antibody(ANA): NEGATIVE

## 2017-03-21 LAB — RHEUMATOID FACTOR: Rheumatoid fact SerPl-aCnc: <14 [IU]/mL

## 2017-03-21 LAB — CYCLIC CITRUL PEPTIDE ANTIBODY, IGG: Cyclic Citrullin Peptide Ab: <16 UNITS

## 2017-05-08 LAB — HM MAMMOGRAPHY

## 2017-05-12 ENCOUNTER — Encounter: Payer: Self-pay | Admitting: Family Medicine

## 2017-06-16 ENCOUNTER — Encounter: Payer: Self-pay | Admitting: Family Medicine

## 2017-06-16 ENCOUNTER — Ambulatory Visit (INDEPENDENT_AMBULATORY_CARE_PROVIDER_SITE_OTHER): Payer: Managed Care, Other (non HMO) | Admitting: Family Medicine

## 2017-06-16 VITALS — BP 150/62 | HR 61 | Temp 98.2°F | Ht 62.0 in | Wt 114.0 lb

## 2017-06-16 DIAGNOSIS — J101 Influenza due to other identified influenza virus with other respiratory manifestations: Secondary | ICD-10-CM | POA: Diagnosis not present

## 2017-06-16 DIAGNOSIS — R6889 Other general symptoms and signs: Secondary | ICD-10-CM

## 2017-06-16 LAB — POCT INFLUENZA A/B
Influenza A, POC: POSITIVE — AB
Influenza B, POC: NEGATIVE

## 2017-06-16 NOTE — Patient Instructions (Addendum)

## 2017-06-16 NOTE — Progress Notes (Signed)
   Subjective:    Patient ID: Christina Wang, female    DOB: [birth year], 57 y.o.   MRN: [id number]  HPI 57 yo female c/ of 4 days of cough, congestion.  Husband dx with flu last week. No fever.  Taking IBU.  No sore throat but she did have some chills for a couple of days.  No ear pain.  No known fever.  No worsening or alleviating factors.  No worsening or alleviating factors.  The cough feels like it is in her upper chest.  It is a little bit more heavy and mucousy in the morning and then post feels like it starting to break up by the time the afternoon comes.   Review of Systems     Objective:   Physical Exam  Constitutional: She is oriented to person, place, and time. She appears well-developed and well-nourished.  HENT:  Head: Normocephalic and atraumatic.  Right Ear: External ear normal.  Left Ear: External ear normal.  Nose: Nose normal.  Mouth/Throat: Oropharynx is clear and moist.  TMs and canals are clear.   Eyes: Conjunctivae and EOM are normal. Pupils are equal, round, and reactive to light.  Neck: Neck supple. No thyromegaly present.  Cardiovascular: Normal rate, regular rhythm and normal heart sounds.  Pulmonary/Chest: Effort normal and breath sounds normal. She has no wheezes.  Lymphadenopathy:    She has no cervical adenopathy.  Neurological: She is alert and oriented to person, place, and time.  Skin: Skin is warm and dry.  Psychiatric: She has a normal mood and affect.        Assessment & Plan:  Flu A / Upper respiratory infection-fortunately influenza test is negative.  Call if not significantly better by the end of the week but she is already feeling much better today. Hold off on [place].  return to work on Wednesday.

## 2017-09-17 ENCOUNTER — Telehealth: Payer: Self-pay | Admitting: Family Medicine

## 2017-09-17 DIAGNOSIS — Z1231 Encounter for screening mammogram for malignant neoplasm of breast: Secondary | ICD-10-CM

## 2017-09-17 NOTE — Telephone Encounter (Signed)
Order placed please sign and print.

## 2017-09-17 NOTE — Telephone Encounter (Signed)
Patient calls and needs order for mammogram so she can [place] the [place]. Call when ready

## 2017-09-18 NOTE — Telephone Encounter (Signed)
Printed and patient has picked it up.

## 2017-11-06 ENCOUNTER — Encounter: Payer: Self-pay | Admitting: Family Medicine

## 2018-07-31 IMAGING — DX DG HAND COMPLETE 3+V*L*
3 series · 3 of 3 positions shown · non-contrast
Comparison: None.

CLINICAL DATA: Left hand pain and swelling at the second and third
MCP joints. No reported injury.

EXAM:
LEFT HAND - COMPLETE 3+ VIEW

[hand pa]
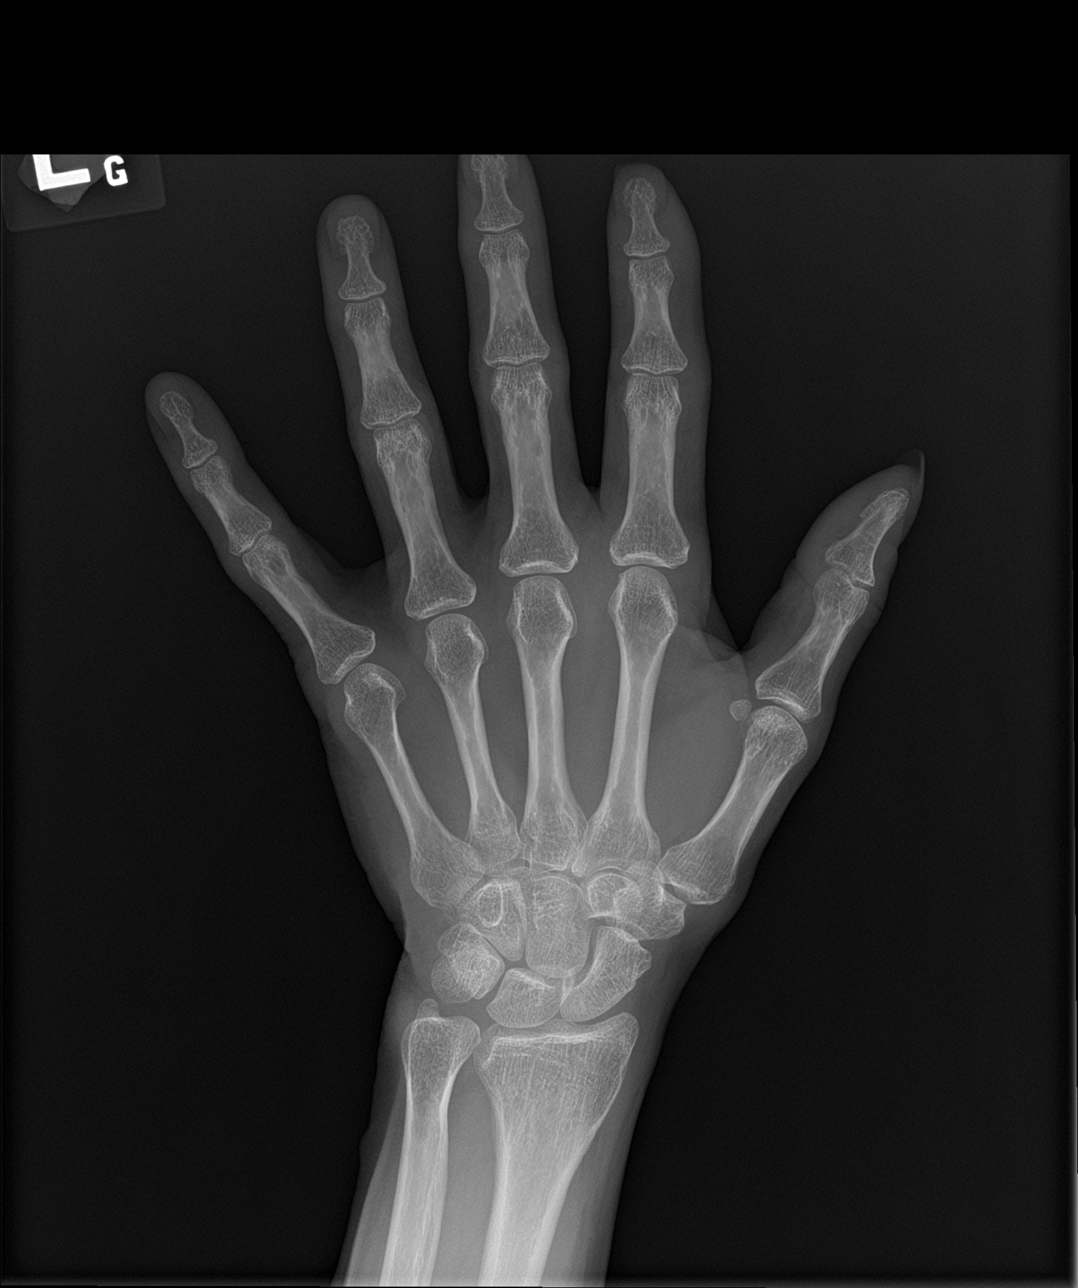

[hand obl]
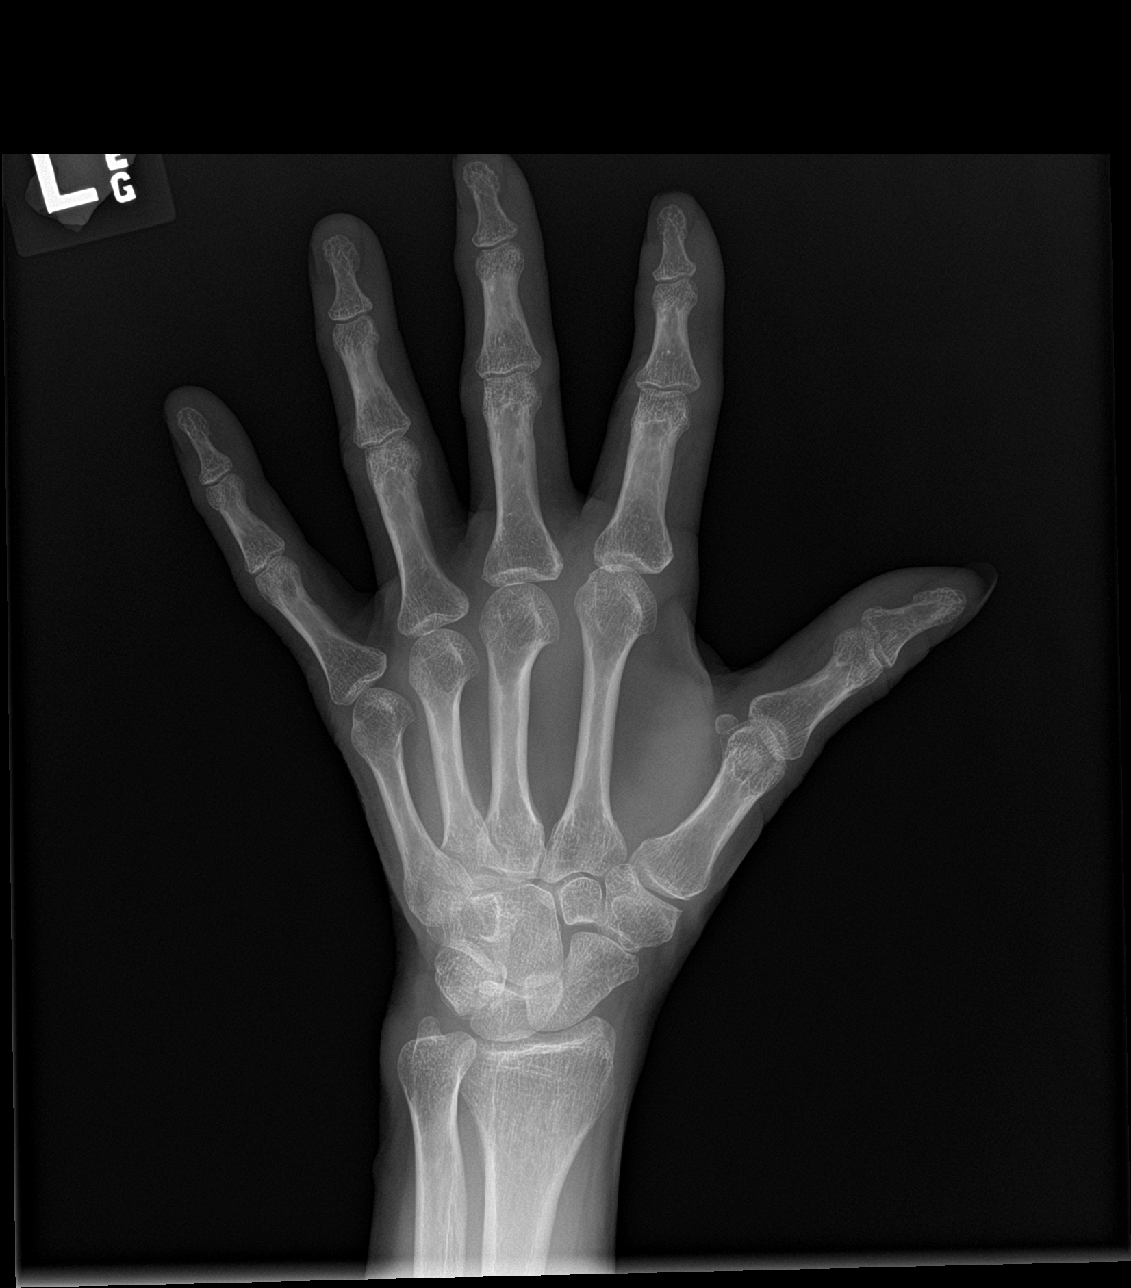

[hand lat]
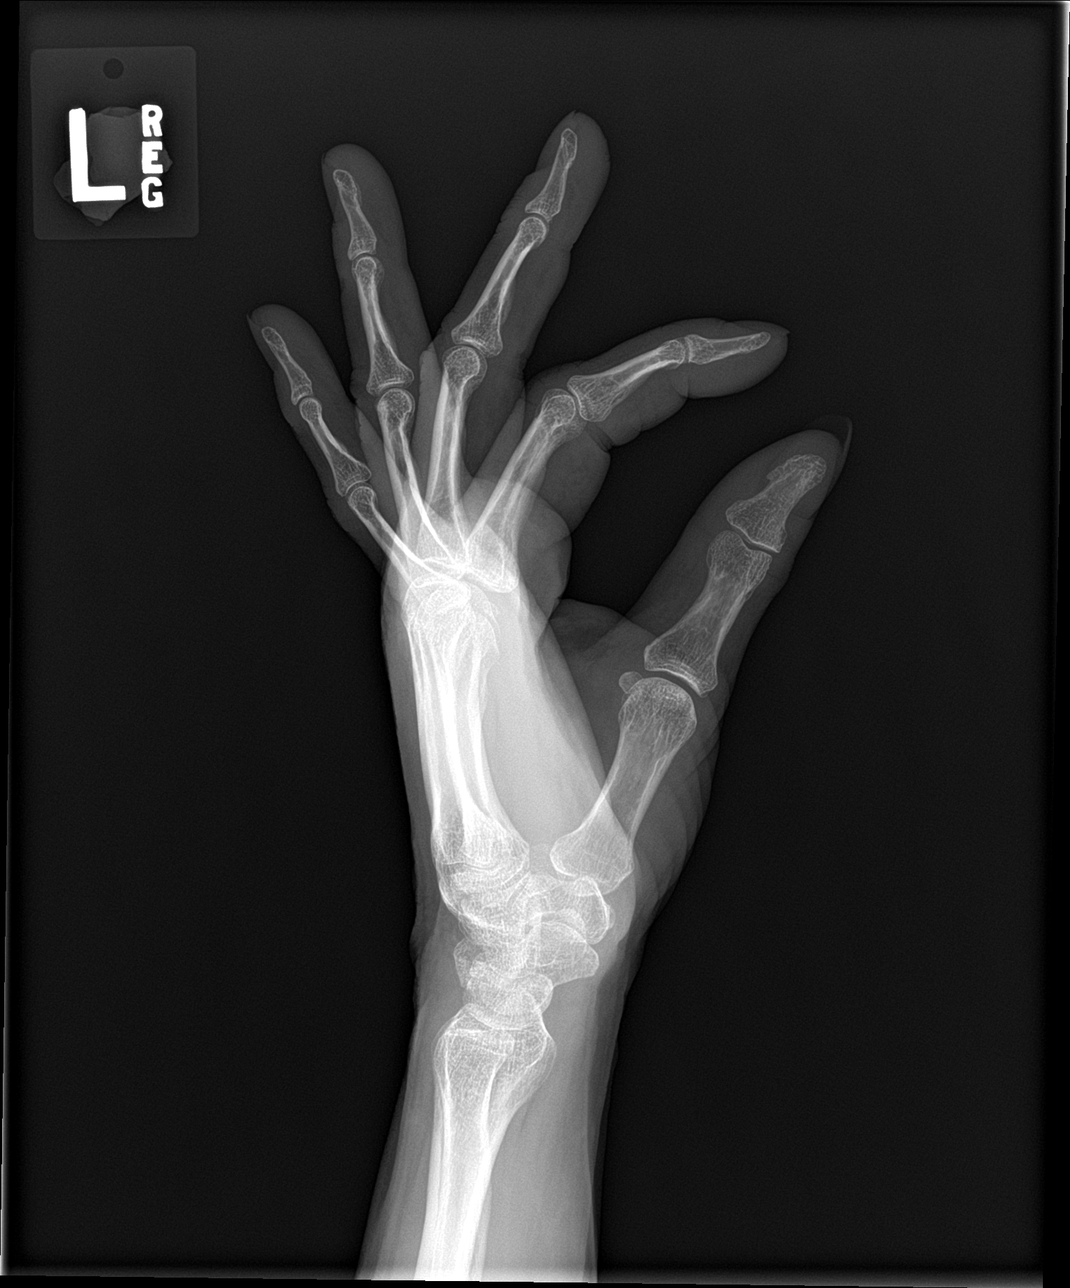

[3 of 3 positions shown; findings below may reference images not displayed]

FINDINGS: No fracture or malalignment. No suspicious focal osseous lesion. No
significant degenerative or erosive arthropathy. No radiopaque
foreign body. No pathologic soft tissue calcifications.
IMPRESSION: No acute osseous abnormality.  No significant arthropathy.

## 2018-10-28 ENCOUNTER — Telehealth: Payer: Self-pay

## 2018-10-28 DIAGNOSIS — Z1239 Encounter for other screening for malignant neoplasm of breast: Secondary | ICD-10-CM

## 2018-10-28 NOTE — Telephone Encounter (Signed)
Christina Wang called and left a message asking for a 3D screening mammogram order for [facility]. She is due for a screening mammogram. Order placed. However, she hasn't been seen in over a year. I called and left her a message that the mammogram was ordered and she needs to call back to schedule a follow up or physical.

## 2018-12-15 LAB — HM MAMMOGRAPHY

## 2018-12-22 ENCOUNTER — Encounter: Payer: Self-pay | Admitting: Family Medicine

## 2018-12-22 ENCOUNTER — Ambulatory Visit (INDEPENDENT_AMBULATORY_CARE_PROVIDER_SITE_OTHER): Payer: Managed Care, Other (non HMO) | Admitting: Family Medicine

## 2018-12-22 VITALS — Wt 112.8 lb

## 2018-12-22 DIAGNOSIS — Z1159 Encounter for screening for other viral diseases: Secondary | ICD-10-CM | POA: Diagnosis not present

## 2018-12-22 DIAGNOSIS — Z13220 Encounter for screening for lipoid disorders: Secondary | ICD-10-CM

## 2018-12-22 NOTE — Progress Notes (Signed)
Pt doesn't have any concerns at this time.Christina Wang, [place]

## 2018-12-22 NOTE — Progress Notes (Signed)
Virtual Visit via Telephone Note  I connected with Christina Wang on 12/22/18 at  3:40 PM EDT by telephone and verified that I am speaking with the correct person using two identifiers.   I discussed the limitations, risks, security and privacy concerns of performing an evaluation and management service by telephone and the availability of in person appointments. I also discussed with the patient that there may be a patient responsible charge related to this service. The patient expressed understanding and agreed to proceed.    Established Patient Office Visit  Subjective:  Patient ID: Christina Wang, female    DOB: 18-May-1960  Age: 58 y.o. MRN: [id number]  CC: No chief complaint on file.   HPI Christina Wang presents for f/u.  She was last seen in March 2019 at the time for flulike symptoms.  She says she does not get the flu shot.  Says the last time she had one she felt like it gave her the flu.  She has had a previous colonoscopy but could not remember exactly [facility] [address] but cannot remember how long ago it was she is not sure if it was greater less than 10 years ago.  In fact she said she is had 4 colonoscopies.  She denies any recent chest pain, shortness of breath or significant mood changes.  She is now currently working 2 jobs.  She denies any new changes or updates to her family history.  She says she can go for blood work but not until October 12 when she has a day off work.  Recently had her mammogram done through [facility] and wanted to give its update on that.  It was normal.  Past Medical History:  Diagnosis Date  . Perimenopause     Past Surgical History:  Procedure Laterality Date  . BTL    . LCTS     X 2    Family History  Problem Relation Age of Onset  . COPD Mother   . Hip fracture Mother   . Hypertension Father   . Coronary artery disease Neg Hx     Social History   Socioeconomic History  . Marital status:  Married    Spouse name: Not on file  . Number of children: Not on file  . Years of education: Not on file  . Highest education level: Not on file  Occupational History  . Not on file  Social Needs  . Financial resource strain: Not on file  . Food insecurity    Worry: Not on file    Inability: Not on file  . Transportation needs    Medical: Not on file    Non-medical: Not on file  Tobacco Use  . Smoking status: Current Every Day Smoker    Packs/day: 1.00    Years: 33.00    Pack years: 33.00  . Smokeless tobacco: Never Used  Substance and Sexual Activity  . Alcohol use: No  . Drug use: Not on file  . Sexual activity: Not on file  Lifestyle  . Physical activity    Days per week: Not on file    Minutes per session: Not on file  . Stress: Not on file  Relationships  . Social Musicianconnections    Talks on phone: Not on file    Gets together: Not on file    Attends religious service: Not on file    Active member of club or organization: Not on file  Attends meetings of clubs or organizations: Not on file    Relationship status: Not on file  . Intimate partner violence    Fear of current or ex partner: Not on file    Emotionally abused: Not on file    Physically abused: Not on file    Forced sexual activity: Not on file  Other Topics Concern  . Not on file  Social History Narrative  . Not on file    No outpatient medications prior to visit.   No facility-administered medications prior to visit.     No Known Allergies  ROS Review of Systems    Objective:    Physical Exam  Constitutional: She is oriented to person, place, and time.  Pulmonary/Chest: Effort normal.  Neurological: She is alert and oriented to person, place, and time.  Psychiatric: She has a normal mood and affect. Her behavior is normal.    Wt 112 lb 12.8 oz (51.2 kg)   LMP 06/07/2010   BMI 20.63 kg/m  Wt Readings from Last 3 Encounters:  12/22/18 112 lb 12.8 oz (51.2 kg)  06/16/17 114 lb  (51.7 kg)  03/20/17 115 lb (52.2 kg)     Health Maintenance Due  Topic Date Due  . Hepatitis C Screening  June 15, 1960  . HIV Screening  04/21/1977  . COLONOSCOPY  04/21/2012  . MAMMOGRAM  05/09/2018    There are no preventive care reminders to display for this patient.  Lab Results  Component Value Date   TSH 0.840 10/21/2009   Lab Results  Component Value Date   WBC 6.4 03/20/2017   HGB 12.1 03/20/2017   HCT 35.3 03/20/2017   MCV 87.2 03/20/2017   PLT 236 03/20/2017   Lab Results  Component Value Date   NA 140 10/21/2009   K 4.1 10/21/2009   CO2 25 10/21/2009   GLUCOSE 88 10/21/2009   BUN 13 10/21/2009   CREATININE 0.59 10/21/2009   BILITOT 0.7 10/21/2009   ALKPHOS 51 10/21/2009   AST 17 10/21/2009   ALT 9 10/21/2009   PROT 6.8 10/21/2009   ALBUMIN 4.5 10/21/2009   CALCIUM 8.8 10/21/2009   Lab Results  Component Value Date   CHOL 197 10/21/2009   Lab Results  Component Value Date   HDL 60 10/21/2009   Lab Results  Component Value Date   LDLCALC 126 (H) 10/21/2009   Lab Results  Component Value Date   TRIG 56 10/21/2009   Lab Results  Component Value Date   CHOLHDL 3.3 Ratio 10/21/2009   No results found for: HGBA1C    Assessment & Plan:   Problem List Items Addressed This Visit    None    Visit Diagnoses    Screening, lipid    -  Primary   Relevant Orders   COMPLETE METABOLIC PANEL WITH GFR   Lipid panel   Hepatitis C Antibody   Encounter for hepatitis C screening test for low risk patient       Relevant Orders   COMPLETE METABOLIC PANEL WITH GFR   Lipid panel   Hepatitis C Antibody     She is actually doing well today and has no specific concerns.  We mostly concentrated on preventative care and recommend that she get some up-to-date blood work.  In fact I do not even have a lipid panel on her.  She did decline the flu vaccine.  We will get up-to-date hepatitis C screening as well.  I did get a copy of her  mammograms will get that  placed into the chart.  Also discussed colonoscopy screening.  She would prefer to have it done locally if she is due.  Declines flu vaccine.     I discussed the assessment and treatment plan with the patient. The patient was provided an opportunity to ask questions and all were answered. The patient agreed with the plan and demonstrated an understanding of the instructions.   The patient was advised to call back or seek an in-person evaluation if the symptoms worsen or if the condition fails to improve as anticipated.  I provided 10 minutes of non-face-to-face time during this encounter.    No orders of the defined types were placed in this encounter.   Follow-up: Return in about 1 year (around 12/22/2019).    Nani Gasser, MD

## 2019-01-19 LAB — COMPLETE METABOLIC PANEL WITH GFR
AG Ratio: 2 (calc) (ref 1.0–2.5)
ALT: 9 U/L (ref 6–29)
AST: 15 U/L (ref 10–35)
Albumin: 4.2 g/dL (ref 3.6–5.1)
Alkaline phosphatase (APISO): 46 U/L (ref 37–153)
BUN: 17 mg/dL (ref 7–25)
CO2: 30 mmol/L (ref 20–32)
Calcium: 9.6 mg/dL (ref 8.6–10.4)
Chloride: 107 mmol/L (ref 98–110)
Creat: 0.67 mg/dL (ref 0.50–1.05)
GFR, Est African American: 114 mL/min/{1.73_m2} (ref >=60)
GFR, Est Non African American: 98 mL/min/{1.73_m2} (ref >=60)
Globulin: 2.1 g/dL (calc) (ref 1.9–3.7)
Glucose, Bld: 104 mg/dL — ABNORMAL HIGH (ref 65–99)
Potassium: 4.1 mmol/L (ref 3.5–5.3)
Sodium: 143 mmol/L (ref 135–146)
Total Bilirubin: 0.4 mg/dL (ref 0.2–1.2)
Total Protein: 6.3 g/dL (ref 6.1–8.1)

## 2019-01-19 LAB — LIPID PANEL
Cholesterol: 208 mg/dL — ABNORMAL HIGH (ref <200)
HDL: 74 mg/dL (ref >=50)
LDL Cholesterol (Calc): 119 mg/dL (calc) — ABNORMAL HIGH
Non-HDL Cholesterol (Calc): 134 mg/dL (calc) — ABNORMAL HIGH (ref <130)
Total CHOL/HDL Ratio: 2.8 (calc) (ref <5.0)
Triglycerides: 55 mg/dL (ref <150)

## 2019-01-19 LAB — HEPATITIS C ANTIBODY
Hepatitis C Ab: NONREACTIVE
SIGNAL TO CUT-OFF: 0.01 (ref <1.00)

## 2019-02-18 ENCOUNTER — Encounter: Payer: Self-pay | Admitting: Family Medicine

## 2019-07-27 ENCOUNTER — Ambulatory Visit (INDEPENDENT_AMBULATORY_CARE_PROVIDER_SITE_OTHER): Payer: Managed Care, Other (non HMO) | Admitting: Family Medicine

## 2019-07-27 ENCOUNTER — Other Ambulatory Visit: Payer: Self-pay

## 2019-07-27 ENCOUNTER — Encounter: Payer: Self-pay | Admitting: Family Medicine

## 2019-07-27 VITALS — BP 91/48 | HR 69 | Ht 62.0 in | Wt 112.0 lb

## 2019-07-27 DIAGNOSIS — M654 Radial styloid tenosynovitis [de Quervain]: Secondary | ICD-10-CM

## 2019-07-27 MED ORDER — MELOXICAM 7.5 MG PO TABS: 7.5000 mg | ORAL_TABLET | Freq: Every day | ORAL | 0 refills | Status: DC

## 2019-07-27 NOTE — Patient Instructions (Signed)
Recommend wear the splint during the daytime.  Okay to take off in the evenings and at night.  Recommend taking the anti-inflammatory that I sent over to your pharmacy as well.  Make sure to take with food and water so it does not upset your stomach it does start to upset your stomach then please just stop it.  If you are not feeling like your wrist is improving over the next 2 to 3 weeks then we will schedule you with one of our sports medicine doctors for a possible injection.

## 2019-07-27 NOTE — Progress Notes (Signed)
Acute Office Visit  Subjective:    Patient ID: Christina Wang, female    DOB: [birth year], 59 y.o.   MRN: [id number]  Chief Complaint  Patient presents with  . Wrist Pain    HPI Patient is in today for right wrist pain x 1 mo.  Has happened before.  She says she does a lot of gripping and repetitive movements at work.  She says certain motions just really triggered and aggravated it.  She says even doing personal care is difficult.  She says the medial wrist at the base of the thumb has been swollen.  She does not member any specific injury or trauma.  Past Medical History:  Diagnosis Date  . Perimenopause     Past Surgical History:  Procedure Laterality Date  . BTL    . LCTS     X 2    Family History  Problem Relation Age of Onset  . COPD Mother   . Hip fracture Mother   . Hypertension Father   . Coronary artery disease Neg Hx     Social History   Socioeconomic History  . Marital status: Married    Spouse name: Not on file  . Number of children: Not on file  . Years of education: Not on file  . Highest education level: Not on file  Occupational History  . Not on file  Tobacco Use  . Smoking status: Current Every Day Smoker    Packs/day: 1.00    Years: 33.00    Pack years: 33.00  . Smokeless tobacco: Never Used  Substance and Sexual Activity  . Alcohol use: No  . Drug use: Not on file  . Sexual activity: Not on file  Other Topics Concern  . Not on file  Social History Narrative  . Not on file   Social Determinants of Health   Financial Resource Strain:   . Difficulty of Paying Living Expenses:   Food Insecurity:   . Worried About Programme researcher, broadcasting/film/video in the Last Year:   . Barista in the Last Year:   Transportation Needs:   . Freight forwarder (Medical):   Marland Kitchen Lack of Transportation (Non-Medical):   Physical Activity:   . Days of Exercise per Week:   . Minutes of Exercise per Session:   Stress:   . Feeling of Stress :   Social  Connections:   . Frequency of Communication with Friends and Family:   . Frequency of Social Gatherings with Friends and Family:   . Attends Religious Services:   . Active Member of Clubs or Organizations:   . Attends Banker Meetings:   Marland Kitchen Marital Status:   Intimate Partner Violence:   . Fear of Current or Ex-Partner:   . Emotionally Abused:   Marland Kitchen Physically Abused:   . Sexually Abused:     No outpatient medications prior to visit.   No facility-administered medications prior to visit.    No Known Allergies  Review of Systems     Objective:    Physical Exam Vitals reviewed.  Constitutional:      Appearance: She is well-developed.  HENT:     Head: Normocephalic and atraumatic.  Eyes:     Conjunctiva/sclera: Conjunctivae normal.  Cardiovascular:     Rate and Rhythm: Normal rate.  Pulmonary:     Effort: Pulmonary effort is normal.  Musculoskeletal:     Comments: Right wrist with normal range of motion.  She does have  some swelling over the medial wrist at the base of the thumb.  Mildly tender on exam.  Nontenderness in the snuffbox.  Positive Finkelstein's test.  Strength and grip in the fingers and thumb is normal.  Skin:    General: Skin is dry.     Coloration: Skin is not pale.  Neurological:     Mental Status: She is alert and oriented to person, place, and time.  Psychiatric:        Behavior: Behavior normal.     BP (!) 91/48   Pulse 69   Ht 5\' 2"  (1.575 m)   Wt 112 lb (50.8 kg)   LMP 06/07/2010   SpO2 98%   BMI 20.49 kg/m  Wt Readings from Last 3 Encounters:  07/27/19 112 lb (50.8 kg)  12/22/18 112 lb 12.8 oz (51.2 kg)  06/16/17 114 lb (51.7 kg)    Health Maintenance Due  Topic Date Due  . HIV Screening  Never done  . COVID-19 Vaccine (1) Never done  . COLONOSCOPY  Never done  . MAMMOGRAM  06/14/2019    There are no preventive care reminders to display for this patient.   Lab Results  Component Value Date   TSH 0.840  10/21/2009   Lab Results  Component Value Date   WBC 6.4 03/20/2017   HGB 12.1 03/20/2017   HCT 35.3 03/20/2017   MCV 87.2 03/20/2017   PLT 236 03/20/2017   Lab Results  Component Value Date   NA 143 01/18/2019   K 4.1 01/18/2019   CO2 30 01/18/2019   GLUCOSE 104 (H) 01/18/2019   BUN 17 01/18/2019   CREATININE 0.67 01/18/2019   BILITOT 0.4 01/18/2019   ALKPHOS 51 10/21/2009   AST 15 01/18/2019   ALT 9 01/18/2019   PROT 6.3 01/18/2019   ALBUMIN 4.5 10/21/2009   CALCIUM 9.6 01/18/2019   Lab Results  Component Value Date   CHOL 208 (H) 01/18/2019   Lab Results  Component Value Date   HDL 74 01/18/2019   Lab Results  Component Value Date   LDLCALC 119 (H) 01/18/2019   Lab Results  Component Value Date   TRIG 55 01/18/2019   Lab Results  Component Value Date   CHOLHDL 2.8 01/18/2019   No results found for: HGBA1C     Assessment & Plan:   Problem List Items Addressed This Visit    None    Visit Diagnoses    De Quervain's disease (tenosynovitis)    -  Primary     De Quervain's tenosynovitis-discussed diagnosis and handout provided with additional information.  Recommend thumb spica removable splint to wear during the day.  Because of what she does for living she will not be able to work with that hand.  So given work note for 2 weeks.  If she feels she is improving more quickly she is welcome to return to work sooner.  Also recommend a trial of anti-inflammatory so we will start Mobic 7.5 mg daily.  Make sure to take with food and water and stop immediately if any GI irritation.  If not improving then recommend follow-up with Dr. Dianah Field, or sports med.for possible injection and further treatment.  Meds ordered this encounter  Medications  . meloxicam (MOBIC) 7.5 MG tablet    Sig: Take 1 tablet (7.5 mg total) by mouth daily.    Dispense:  30 tablet    Refill:  0     Beatrice Lecher, MD

## 2019-08-06 ENCOUNTER — Encounter: Payer: Self-pay | Admitting: Family Medicine

## 2019-08-06 ENCOUNTER — Other Ambulatory Visit: Payer: Self-pay

## 2019-08-06 ENCOUNTER — Telehealth: Payer: Self-pay | Admitting: *Deleted

## 2019-08-06 ENCOUNTER — Ambulatory Visit (INDEPENDENT_AMBULATORY_CARE_PROVIDER_SITE_OTHER): Payer: Managed Care, Other (non HMO) | Admitting: Family Medicine

## 2019-08-06 VITALS — BP 91/40 | HR 65 | Ht 62.0 in | Wt 115.0 lb

## 2019-08-06 DIAGNOSIS — M654 Radial styloid tenosynovitis [de Quervain]: Secondary | ICD-10-CM | POA: Diagnosis not present

## 2019-08-06 NOTE — Progress Notes (Signed)
Pt is due to RTW on Aug 09, 2019. She states that she doesn't feel that her condition is improving. She said that she can't tell the difference when she removes her brace and that it feels the same.   She stated that at her job it is a lot of repetitive motion with her hands and she doesn't know if she will be able to go back to work at this time.

## 2019-08-06 NOTE — Progress Notes (Signed)
Established Patient Office Visit  Subjective:  Patient ID: Christina Wang, female    DOB: 04-Jan-1961  Age: 59 y.o. MRN: [id number]  CC: No chief complaint on file.   HPI Takila Kronberg presents for a little bit of de Quervain's tenosynovitis.  I saw her approximately 10 days ago as she was having right wrist pain and diagnosed her with this.  Recommended a thumb spica for 2 weeks to wear during the day and then wrote her out of work for 2 weeks to return sooner if she felt like it was improving.  Also recommended oral anti-inflammatory prescription for Mobic given.  Unfortunately she feels like it is not better.  She says once in the splint it feels fine she does not really have a lot of discomfort or pain but as soon as she takes it off the pain returns.  Originally hoped that she would be would return to work next Tuesday.  She does a lot of repetitive movements of her hand and wrist for work.  Past Medical History:  Diagnosis Date  . Perimenopause     Past Surgical History:  Procedure Laterality Date  . BTL    . LCTS     X 2    Family History  Problem Relation Age of Onset  . COPD Mother   . Hip fracture Mother   . Hypertension Father   . Coronary artery disease Neg Hx     Social History   Socioeconomic History  . Marital status: Married    Spouse name: Not on file  . Number of children: Not on file  . Years of education: Not on file  . Highest education level: Not on file  Occupational History  . Not on file  Tobacco Use  . Smoking status: Current Every Day Smoker    Packs/day: 1.00    Years: 33.00    Pack years: 33.00  . Smokeless tobacco: Never Used  Substance and Sexual Activity  . Alcohol use: No  . Drug use: Not on file  . Sexual activity: Not on file  Other Topics Concern  . Not on file  Social History Narrative  . Not on file   Social Determinants of Health   Financial Resource Strain:   . Difficulty of Paying Living Expenses:   Food Insecurity:    . Worried About Charity fundraiser in the Last Year:   . Arboriculturist in the Last Year:   Transportation Needs:   . Film/video editor (Medical):   Marland Kitchen Lack of Transportation (Non-Medical):   Physical Activity:   . Days of Exercise per Week:   . Minutes of Exercise per Session:   Stress:   . Feeling of Stress :   Social Connections:   . Frequency of Communication with Friends and Family:   . Frequency of Social Gatherings with Friends and Family:   . Attends Religious Services:   . Active Member of Clubs or Organizations:   . Attends Archivist Meetings:   Marland Kitchen Marital Status:   Intimate Partner Violence:   . Fear of Current or Ex-Partner:   . Emotionally Abused:   Marland Kitchen Physically Abused:   . Sexually Abused:     Outpatient Medications Prior to Visit  Medication Sig Dispense Refill  . meloxicam (MOBIC) 7.5 MG tablet Take 1 tablet (7.5 mg total) by mouth daily. 30 tablet 0   No facility-administered medications prior to visit.    No Known Allergies  ROS Review of Systems    Objective:    Physical Exam  Constitutional: She is oriented to person, place, and time. She appears well-developed and well-nourished.  HENT:  Head: Normocephalic and atraumatic.  Eyes: Conjunctivae and EOM are normal.  Cardiovascular: Normal rate.  Pulmonary/Chest: Effort normal.  Musculoskeletal:     Comments: She is tender over the medial wrist towards the base of the thumb.  Positive Finkelstein's test.  Normal range of motion of the thumb.  Neurological: She is alert and oriented to person, place, and time.  Skin: Skin is dry. No pallor.  Psychiatric: She has a normal mood and affect. Her behavior is normal.  Vitals reviewed.   BP (!) 91/40   Pulse 65   Ht 5\' 2"  (1.575 m)   Wt 115 lb (52.2 kg)   LMP 06/07/2010   SpO2 100%   BMI 21.03 kg/m  Wt Readings from Last 3 Encounters:  08/06/19 115 lb (52.2 kg)  07/27/19 112 lb (50.8 kg)  12/22/18 112 lb 12.8 oz (51.2 kg)      There are no preventive care reminders to display for this patient.  There are no preventive care reminders to display for this patient.  Lab Results  Component Value Date   TSH 0.840 10/21/2009   Lab Results  Component Value Date   WBC 6.4 03/20/2017   HGB 12.1 03/20/2017   HCT 35.3 03/20/2017   MCV 87.2 03/20/2017   PLT 236 03/20/2017   Lab Results  Component Value Date   NA 143 01/18/2019   K 4.1 01/18/2019   CO2 30 01/18/2019   GLUCOSE 104 (H) 01/18/2019   BUN 17 01/18/2019   CREATININE 0.67 01/18/2019   BILITOT 0.4 01/18/2019   ALKPHOS 51 10/21/2009   AST 15 01/18/2019   ALT 9 01/18/2019   PROT 6.3 01/18/2019   ALBUMIN 4.5 10/21/2009   CALCIUM 9.6 01/18/2019   Lab Results  Component Value Date   CHOL 208 (H) 01/18/2019   Lab Results  Component Value Date   HDL 74 01/18/2019   Lab Results  Component Value Date   LDLCALC 119 (H) 01/18/2019   Lab Results  Component Value Date   TRIG 55 01/18/2019   Lab Results  Component Value Date   CHOLHDL 2.8 01/18/2019   No results found for: HGBA1C    Assessment & Plan:   Problem List Items Addressed This Visit    None    Visit Diagnoses    De Quervain's disease (tenosynovitis)    -  Primary     Right wrist pain-not improving after 2 weeks of conservative treatment with bracing, anti-inflammatory, and ice.  She has not started the rehab exercises but did encourage her to do that this weekend.  Also discussed getting in with our sports med docs on Monday for further work-up and evaluation.  No orders of the defined types were placed in this encounter.   Follow-up: Return in about 3 days (around 08/09/2019) for with Dr. 10/09/2019 for her wrist pain, not improving. Karie Schwalbe, MD

## 2019-08-06 NOTE — Telephone Encounter (Signed)
Pt's fitness for duty form completed and faxed. Confirmation received and original copy and confirmation given to pt prior to leaving the office.   Copy made and scanned into chart.

## 2019-08-09 ENCOUNTER — Encounter: Payer: Self-pay | Admitting: Sports Medicine

## 2019-08-09 ENCOUNTER — Ambulatory Visit (INDEPENDENT_AMBULATORY_CARE_PROVIDER_SITE_OTHER): Payer: Managed Care, Other (non HMO) | Admitting: Sports Medicine

## 2019-08-09 DIAGNOSIS — M654 Radial styloid tenosynovitis [de Quervain]: Secondary | ICD-10-CM

## 2019-08-09 NOTE — Assessment & Plan Note (Signed)
This is a very pleasant 59 year old female with right wrist pain, present for over 3 weeks now she has had de Quervain's rehab exercises, NSAIDs, spica bracing. Persistent discomfort. On exam she has tenderness over the first extensor compartment with a positive Finkelstein sign. Today I placed an injection between the abductor pollicis longus and the extensor pollicis brevis tendon sheath, good relief of pain post procedure. Continue brace for another 3 days, continue rehab exercises and return to see me in a month. It sounds like he goes back to work in about a week.

## 2019-08-09 NOTE — Progress Notes (Signed)
    Procedures performed today:    Procedure: Real-time Ultrasound Guided injection of the right first extensor compartment Device: Samsung HS60  Verbal informed consent obtained.  Time-out conducted.  Noted no overlying erythema, induration, or other signs of local infection.  Skin prepped in a sterile fashion.  Local anesthesia: Topical Ethyl chloride.  With sterile technique and under real time ultrasound guidance:  1 cc lidocaine, 1 cc bupivacaine and 1 cc Kenalog 40 injected easily into the first extensor compartment sheath  completed without difficulty  Pain immediately resolved suggesting accurate placement of the medication.  Advised to call if fevers/chills, erythema, induration, drainage, or persistent bleeding.  Images permanently stored and available for review in the ultrasound unit.  Impression: Technically successful ultrasound guided injection.  Independent interpretation of notes and tests performed by another provider:   None.  Brief History, Exam, Impression, and Recommendations:    De Quervain's tenosynovitis, right This is a very pleasant 59 year old female with right wrist pain, present for over 3 weeks now she has had de Quervain's rehab exercises, NSAIDs, spica bracing. Persistent discomfort. On exam she has tenderness over the first extensor compartment with a positive Finkelstein sign. Today I placed an injection between the abductor pollicis longus and the extensor pollicis brevis tendon sheath, good relief of pain post procedure. Continue brace for another 3 days, continue rehab exercises and return to see me in a month. It sounds like he goes back to work in about a week.    ___________________________________________ Ihor Austin. Benjamin Stain, M.D., ABFM., CAQSM. Primary Care and Sports Medicine [facility]  Adjunct Instructor of Family Medicine  [employer]

## 2019-08-25 ENCOUNTER — Ambulatory Visit (INDEPENDENT_AMBULATORY_CARE_PROVIDER_SITE_OTHER): Payer: Managed Care, Other (non HMO) | Admitting: Sports Medicine

## 2019-08-25 DIAGNOSIS — L603 Nail dystrophy: Secondary | ICD-10-CM | POA: Insufficient documentation

## 2019-08-25 DIAGNOSIS — M654 Radial styloid tenosynovitis [de Quervain]: Secondary | ICD-10-CM

## 2019-08-25 MED ORDER — TERBINAFINE HCL 250 MG PO TABS: 250.0000 mg | ORAL_TABLET | Freq: Every day | ORAL | 1 refills | Status: DC

## 2019-08-25 MED ORDER — DOXYCYCLINE HYCLATE 100 MG PO TABS: 100.0000 mg | ORAL_TABLET | Freq: Two times a day (BID) | ORAL | 0 refills | Status: AC

## 2019-08-25 NOTE — Assessment & Plan Note (Signed)
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 She has some eponychia at the base of the great toenails. We are to treat this aggressively with an antifungal for 6 months and doxycycline for a week. I do explain to her that sometimes this takes 9 months to treat, we can check her LFTs in 1 month. Return to see me for this in approximately 3 months to see if new nail is growing out.

## 2019-08-25 NOTE — Assessment & Plan Note (Signed)
Christina Wang returns, we did a first extensor compartment injection back on 5 May. She is essentially pain-free.

## 2019-08-25 NOTE — Progress Notes (Signed)
    Procedures performed today:    None.  Independent interpretation of notes and tests performed by another provider:   None.  Brief History, Exam, Impression, and Recommendations:    De Quervain's tenosynovitis, right Christina Wang returns, we did a first extensor compartment injection back on 5 May. She is essentially pain-free.  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 She has some eponychia at the base of the great toenails. We are to treat this aggressively with an antifungal for 6 months and doxycycline for a week. I do explain to her that sometimes this takes 9 months to treat, we can check her LFTs in 1 month. Return to see me for this in approximately 3 months to see if new nail is growing out.    ___________________________________________ Ihor Austin. Benjamin Stain, M.D., ABFM., CAQSM. Primary Care and Sports Medicine [facility]  Adjunct Instructor of Family Medicine  [employer]

## 2019-09-08 ENCOUNTER — Ambulatory Visit: Payer: Managed Care, Other (non HMO) | Admitting: Sports Medicine

## 2019-09-14 ENCOUNTER — Encounter: Payer: Self-pay | Admitting: Sports Medicine

## 2019-09-14 ENCOUNTER — Ambulatory Visit (INDEPENDENT_AMBULATORY_CARE_PROVIDER_SITE_OTHER): Payer: Managed Care, Other (non HMO) | Admitting: Sports Medicine

## 2019-09-14 DIAGNOSIS — R21 Rash and other nonspecific skin eruption: Secondary | ICD-10-CM | POA: Insufficient documentation

## 2019-09-14 DIAGNOSIS — L603 Nail dystrophy: Secondary | ICD-10-CM | POA: Diagnosis not present

## 2019-09-14 MED ORDER — PREDNISONE 50 MG PO TABS: ORAL_TABLET | ORAL | 0 refills | Status: DC

## 2019-09-14 NOTE — Progress Notes (Signed)
° ° °  Procedures performed today:    None.  Independent interpretation of notes and tests performed by another provider:   None.  Brief History, Exam, Impression, and Recommendations:    Skin rash Christina Wang is having a diffuse erythematous macular rash on her back, arms, groin, legs. The only real change in medications was terbinafine which was started about 3 weeks prior. The rash is minimally pruritic, no mucosal involvement or pulmonary involvement. Adding 5 days of prednisone, discontinue Lamisil.  Onychodystrophy Christina Wang has now done approximately 3 to 4 weeks of Lamisil, toenails do not look any different, unfortunately it looks like she may have developed an allergic reaction to this medication, with a diffuse macular rash. We we will probably bring her back in a few weeks and do a nail biopsy to look for fungal elements before consideration of starting Jublia.    ___________________________________________ Ihor Austin. Benjamin Stain, M.D., ABFM., CAQSM. Primary Care and Sports Medicine [facility] [facility]  Adjunct Instructor of Family Medicine  [employer]

## 2019-09-14 NOTE — Assessment & Plan Note (Signed)
Christina Wang has now done approximately 3 to 4 weeks of Lamisil, toenails do not look any different, unfortunately it looks like she may have developed an allergic reaction to this medication, with a diffuse macular rash. We we will probably bring her back in a few weeks and do a nail biopsy to look for fungal elements before consideration of starting Jublia.

## 2019-09-14 NOTE — Assessment & Plan Note (Signed)
Christina Wang is having a diffuse erythematous macular rash on her back, arms, groin, legs. The only real change in medications was terbinafine which was started about 3 weeks prior. The rash is minimally pruritic, no mucosal involvement or pulmonary involvement. Adding 5 days of prednisone, discontinue Lamisil.

## 2019-09-21 LAB — COMPLETE METABOLIC PANEL WITH GFR
AG Ratio: 1.9 (calc) (ref 1.0–2.5)
ALT: 10 U/L (ref 6–29)
AST: 14 U/L (ref 10–35)
Albumin: 4.2 g/dL (ref 3.6–5.1)
Alkaline phosphatase (APISO): 59 U/L (ref 37–153)
BUN: 13 mg/dL (ref 7–25)
CO2: 32 mmol/L (ref 20–32)
Calcium: 9.4 mg/dL (ref 8.6–10.4)
Chloride: 104 mmol/L (ref 98–110)
Creat: 0.75 mg/dL (ref 0.50–1.05)
GFR, Est African American: 103 mL/min/{1.73_m2} (ref >=60)
GFR, Est Non African American: 88 mL/min/{1.73_m2} (ref >=60)
Globulin: 2.2 g/dL (calc) (ref 1.9–3.7)
Glucose, Bld: 81 mg/dL (ref 65–99)
Potassium: 3.8 mmol/L (ref 3.5–5.3)
Sodium: 143 mmol/L (ref 135–146)
Total Bilirubin: 0.3 mg/dL (ref 0.2–1.2)
Total Protein: 6.4 g/dL (ref 6.1–8.1)

## 2019-11-26 ENCOUNTER — Ambulatory Visit: Payer: Managed Care, Other (non HMO) | Admitting: Sports Medicine

## 2020-02-01 LAB — HM MAMMOGRAPHY

## 2020-06-15 ENCOUNTER — Encounter: Payer: Self-pay | Admitting: Family Medicine

## 2021-01-31 ENCOUNTER — Other Ambulatory Visit: Payer: Self-pay | Admitting: Family Medicine

## 2021-01-31 DIAGNOSIS — Z1231 Encounter for screening mammogram for malignant neoplasm of breast: Secondary | ICD-10-CM

## 2021-02-15 ENCOUNTER — Other Ambulatory Visit: Payer: Self-pay | Admitting: *Deleted

## 2021-02-15 DIAGNOSIS — Z1231 Encounter for screening mammogram for malignant neoplasm of breast: Secondary | ICD-10-CM

## 2021-07-24 LAB — HM MAMMOGRAPHY

## 2021-08-17 ENCOUNTER — Encounter: Payer: Self-pay | Admitting: Family Medicine

## 2022-07-02 ENCOUNTER — Ambulatory Visit (INDEPENDENT_AMBULATORY_CARE_PROVIDER_SITE_OTHER): Payer: Managed Care, Other (non HMO) | Admitting: Sports Medicine

## 2022-07-02 DIAGNOSIS — M778 Other enthesopathies, not elsewhere classified: Secondary | ICD-10-CM

## 2022-07-02 MED ORDER — NAPROXEN 500 MG PO TABS: 500.0000 mg | ORAL_TABLET | Freq: Two times a day (BID) | ORAL | 3 refills | Status: AC

## 2022-07-02 NOTE — Progress Notes (Signed)
    Procedures performed today:    None.  Independent interpretation of notes and tests performed by another provider:   None.  Brief History, Exam, Impression, and Recommendations:    Tendinitis of extensor tendon of left hand Pleasant 62 year old female, she works on an Designer, television/film set with repetitive motion, for the past week she has noted swelling dorsum left hand over the metacarpals, worse with motion. This is started to improve on its own with Biofreeze. On exam she has what appears to be a fourth extensor compartment tendinitis, minimal crepitus, for the most part no pain, good strength. As this is starting to improve we will hold off on injection, will do naproxen and home physical therapy, return to see me in approximately 4 weeks for reevaluation.    ____________________________________________ Gwen Her. Dianah Field, M.D., ABFM., CAQSM., AME. Primary Care and Sports Medicine [facility] [facility]  Adjunct Professor of [place] [employer]  Risk manager

## 2022-07-02 NOTE — Assessment & Plan Note (Signed)
Pleasant 62 year old female, she works on an Designer, television/film set with repetitive motion, for the past week she has noted swelling dorsum left hand over the metacarpals, worse with motion. This is started to improve on its own with Biofreeze. On exam she has what appears to be a fourth extensor compartment tendinitis, minimal crepitus, for the most part no pain, good strength. As this is starting to improve we will hold off on injection, will do naproxen and home physical therapy, return to see me in approximately 4 weeks for reevaluation.

## 2022-07-18 ENCOUNTER — Ambulatory Visit: Payer: Managed Care, Other (non HMO)

## 2022-07-18 ENCOUNTER — Ambulatory Visit: Payer: Managed Care, Other (non HMO) | Admitting: Sports Medicine

## 2022-07-18 DIAGNOSIS — M546 Pain in thoracic spine: Secondary | ICD-10-CM

## 2022-07-18 DIAGNOSIS — M542 Cervicalgia: Secondary | ICD-10-CM | POA: Diagnosis not present

## 2022-07-18 DIAGNOSIS — R0789 Other chest pain: Secondary | ICD-10-CM | POA: Diagnosis not present

## 2022-07-18 DIAGNOSIS — M5030 Other cervical disc degeneration, unspecified cervical region: Secondary | ICD-10-CM | POA: Insufficient documentation

## 2022-07-18 LAB — COMPREHENSIVE METABOLIC PANEL
AG Ratio: 1.8 (calc) (ref 1.0–2.5)
ALT: 10 U/L (ref 6–29)
AST: 16 U/L (ref 10–35)
Albumin: 4.5 g/dL (ref 3.6–5.1)
Alkaline phosphatase (APISO): 58 U/L (ref 37–153)
BUN: 16 mg/dL (ref 7–25)
CO2: 30 mmol/L (ref 20–32)
Calcium: 9.7 mg/dL (ref 8.6–10.4)
Chloride: 103 mmol/L (ref 98–110)
Creat: 0.58 mg/dL (ref 0.50–1.05)
Globulin: 2.5 g/dL (calc) (ref 1.9–3.7)
Glucose, Bld: 82 mg/dL (ref 65–99)
Potassium: 3.9 mmol/L (ref 3.5–5.3)
Sodium: 140 mmol/L (ref 135–146)
Total Bilirubin: 0.4 mg/dL (ref 0.2–1.2)
Total Protein: 7 g/dL (ref 6.1–8.1)

## 2022-07-18 LAB — CBC
HCT: 37.6 % (ref 35.0–45.0)
Hemoglobin: 12.5 g/dL (ref 11.7–15.5)
MCH: 30.4 pg (ref 27.0–33.0)
MCHC: 33.2 g/dL (ref 32.0–36.0)
MCV: 91.5 fL (ref 80.0–100.0)
MPV: 9.5 fL (ref 7.5–12.5)
Platelets: 294 10*3/uL (ref 140–400)
RBC: 4.11 10*6/uL (ref 3.80–5.10)
RDW: 12.8 % (ref 11.0–15.0)
WBC: 6.7 10*3/uL (ref 3.8–10.8)

## 2022-07-18 LAB — D-DIMER, QUANTITATIVE: D-Dimer, Quant: 0.22 mcg/mL FEU (ref <0.50)

## 2022-07-18 MED ORDER — PREDNISONE 50 MG PO TABS: ORAL_TABLET | ORAL | 0 refills | Status: DC

## 2022-07-18 NOTE — Assessment & Plan Note (Addendum)
Pleasant 62 year old female, she has a repetitive motion type job, for the past week or 2 she has had increasing pain right-sided lower periscapular, rhomboid with radiation around the right chest, to the anterior axillary line just below the level of the nipple. She does endorse worsening of pain with deep breathing. No leg swelling, no history of blood clots, no viral symptoms. I am unable to reproduce the pain on exam by palpation. We will start with 5 days of steroids, x-rays of her cervical, thoracic, chest. I would like a D-dimer. Out of work for 2 days. Home conditioning for the rhomboids given, return to see me in about 4 weeks.  Update: Patient calls back, her prednisone is finishing and she is wondering what to do, she still has pain, adding a 12-day taper.

## 2022-07-18 NOTE — Progress Notes (Addendum)
    Procedures performed today:    None.  Independent interpretation of notes and tests performed by another provider:   None.  Brief History, Exam, Impression, and Recommendations:    Right-sided chest wall pain Pleasant 62 year old female, she has a repetitive motion type job, for the past week or 2 she has had increasing pain right-sided lower periscapular, rhomboid with radiation around the right chest, to the anterior axillary line just below the level of the nipple. She does endorse worsening of pain with deep breathing. No leg swelling, no history of blood clots, no viral symptoms. I am unable to reproduce the pain on exam by palpation. We will start with 5 days of steroids, x-rays of her cervical, thoracic, chest. I would like a D-dimer. Out of work for 2 days. Home conditioning for the rhomboids given, return to see me in about 4 weeks.  Update: Patient calls back, her prednisone is finishing and she is wondering what to do, she still has pain, adding a 12-day taper.    ____________________________________________ Ihor Austin. Benjamin Stain, M.D., ABFM., CAQSM., AME. Primary Care and Sports Medicine [facility] [facility]  Adjunct Professor of Family Medicine  [employer] [facility] [employer]  Restaurant manager, fast food

## 2022-07-22 MED ORDER — PREDNISONE 10 MG (48) PO TBPK: ORAL_TABLET | Freq: Every day | ORAL | 0 refills | Status: DC

## 2022-07-22 NOTE — Addendum Note (Signed)
Addended by: Monica Becton on: 07/22/2022 04:46 PM   Modules accepted: Orders

## 2022-07-29 LAB — HM MAMMOGRAPHY

## 2022-07-30 ENCOUNTER — Ambulatory Visit: Payer: Managed Care, Other (non HMO) | Admitting: Sports Medicine

## 2022-08-13 ENCOUNTER — Encounter: Payer: Self-pay | Admitting: Sports Medicine

## 2022-08-13 ENCOUNTER — Ambulatory Visit: Payer: Managed Care, Other (non HMO) | Admitting: Sports Medicine

## 2022-08-13 VITALS — BP 115/72 | HR 85

## 2022-08-13 DIAGNOSIS — M5030 Other cervical disc degeneration, unspecified cervical region: Secondary | ICD-10-CM | POA: Diagnosis not present

## 2022-08-13 NOTE — Progress Notes (Signed)
    Procedures performed today:    None.  Independent interpretation of notes and tests performed by another provider:   None.  Brief History, Exam, Impression, and Recommendations:    DDD (degenerative disc disease), cervical Pleasant 62 year old female, she has a repetitive type motion job, I saw her about 3 weeks ago with increasing neck pain, radiating around the right periscapular region. We treated her with x-rays, steroids, she improved considerably. We will have her continue home conditioning for the cervical spine, she can return to see me on an as-needed basis and we can proceed with MRI versus Neurontin if not better.    ____________________________________________ Ihor Austin. Benjamin Stain, M.D., ABFM., CAQSM., AME. Primary Care and Sports Medicine [place] [facility]  Adjunct Professor of Family Medicine  [place] [employer] [facility] [employer]  Restaurant manager, fast food

## 2022-08-13 NOTE — Assessment & Plan Note (Signed)
Pleasant 62 year old female, she has a repetitive type motion job, I saw her about 3 weeks ago with increasing neck pain, radiating around the right periscapular region. We treated her with x-rays, steroids, she improved considerably. We will have her continue home conditioning for the cervical spine, she can return to see me on an as-needed basis and we can proceed with MRI versus Neurontin if not better.

## 2022-08-16 ENCOUNTER — Encounter: Payer: Self-pay | Admitting: Family Medicine

## 2022-09-24 ENCOUNTER — Ambulatory Visit: Payer: Managed Care, Other (non HMO)

## 2022-09-24 ENCOUNTER — Ambulatory Visit: Payer: Managed Care, Other (non HMO) | Admitting: Sports Medicine

## 2022-09-24 DIAGNOSIS — M7551 Bursitis of right shoulder: Secondary | ICD-10-CM

## 2022-09-24 DIAGNOSIS — M5030 Other cervical disc degeneration, unspecified cervical region: Secondary | ICD-10-CM

## 2022-09-24 NOTE — Progress Notes (Signed)
    Procedures performed today:    None.  Independent interpretation of notes and tests performed by another provider:   None.  Brief History, Exam, Impression, and Recommendations:    DDD (degenerative disc disease), cervical Resolved with conservative treatment.  Acute shoulder bursitis, right Mild pain right shoulder over the deltoid, worse with abduction, mildly positive Hawkins test but everything else was negative, adding some rotator cuff conditioning, x-rays, she can see me back as needed.  I spent 30 minutes of total time managing this patient today, this includes chart review, face to face, and non-face to face time.  ____________________________________________ Ihor Austin. Benjamin Stain, M.D., ABFM., CAQSM., AME. Primary Care and Sports Medicine [place] [facility]  Adjunct Professor of Family Medicine  [place] [employer] [facility] [employer] Medicine  Restaurant manager, fast food

## 2022-09-24 NOTE — Assessment & Plan Note (Signed)
Mild pain right shoulder over the deltoid, worse with abduction, mildly positive Hawkins test but everything else was negative, adding some rotator cuff conditioning, x-rays, she can see me back as needed.

## 2022-09-24 NOTE — Assessment & Plan Note (Signed)
Resolved with conservative treatment. 

## 2023-08-12 ENCOUNTER — Encounter: Payer: Self-pay | Admitting: Sports Medicine

## 2023-08-12 ENCOUNTER — Ambulatory Visit: Admitting: Sports Medicine

## 2023-08-12 DIAGNOSIS — M65342 Trigger finger, left ring finger: Secondary | ICD-10-CM | POA: Diagnosis not present

## 2023-08-12 DIAGNOSIS — M6530 Trigger finger, unspecified finger: Secondary | ICD-10-CM | POA: Diagnosis not present

## 2023-08-12 DIAGNOSIS — M65321 Trigger finger, right index finger: Secondary | ICD-10-CM | POA: Diagnosis not present

## 2023-08-12 MED ORDER — PREDNISONE 50 MG PO TABS: ORAL_TABLET | ORAL | 0 refills | Status: DC

## 2023-08-12 MED ORDER — MELOXICAM 15 MG PO TABS: ORAL_TABLET | ORAL | 3 refills | Status: AC

## 2023-08-12 NOTE — Progress Notes (Signed)
    Procedures performed today:    None.  Independent interpretation of notes and tests performed by another provider:   None.  Brief History, Exam, Impression, and Recommendations:    Trigger finger, right second and left fourth finger Volar pain with triggering, palpable flexor tendon nodules, we discussed the anatomy and pathophysiology, I did 5 days of prednisone  followed by meloxicam , home physical therapy, return in 4 to 6 weeks, injections if not better.  Chronic process with exacerbation and pharmacologic intervention  ____________________________________________ Joselyn Nicely. Sandy Crumb, M.D., ABFM., CAQSM., AME. Primary Care and Sports Medicine [place] [facility]  Adjunct Professor of [facility] Medicine  [employer]  Restaurant manager, fast food

## 2023-08-12 NOTE — Assessment & Plan Note (Signed)
 Volar pain with triggering, palpable flexor tendon nodules, we discussed the anatomy and pathophysiology, I did 5 days of prednisone  followed by meloxicam , home physical therapy, return in 4 to 6 weeks, injections if not better.

## 2023-09-23 ENCOUNTER — Other Ambulatory Visit (INDEPENDENT_AMBULATORY_CARE_PROVIDER_SITE_OTHER): Payer: Self-pay

## 2023-09-23 ENCOUNTER — Ambulatory Visit: Admitting: Sports Medicine

## 2023-09-23 DIAGNOSIS — M65342 Trigger finger, left ring finger: Secondary | ICD-10-CM | POA: Diagnosis not present

## 2023-09-23 DIAGNOSIS — M6530 Trigger finger, unspecified finger: Secondary | ICD-10-CM

## 2023-09-23 MED ORDER — TRIAMCINOLONE ACETONIDE 40 MG/ML IJ SUSP
20.0000 mg | Freq: Once | INTRAMUSCULAR | Status: AC
  Administered 2023-09-23: 20 mg via INTRA_ARTICULAR

## 2023-09-23 NOTE — Addendum Note (Signed)
 Addended by: Montgomery Apgar on: 09/23/2023 04:06 PM   Modules accepted: Orders

## 2023-09-23 NOTE — Assessment & Plan Note (Signed)
 Failure of conservative treatment, we injected left fourth flexor tendon sheath. She still has some pain right hand but not enough to do an injection. Return to see me in 6 weeks.

## 2023-09-23 NOTE — Progress Notes (Signed)
    Procedures performed today:    Procedure: Real-time Ultrasound Guided injection of the left fourth flexor sheath Device: Samsung HS60  Verbal informed consent obtained.  Time-out conducted.  Noted no overlying erythema, induration, or other signs of local infection.  Skin prepped in a sterile fashion.  Local anesthesia: Topical Ethyl chloride.  With sterile technique and under real time ultrasound guidance: 0.5 cc lidocaine, 0.5 cc kenalog  40 injected easily. Completed without difficulty  Advised to call if fevers/chills, erythema, induration, drainage, or persistent bleeding.  Images permanently stored and available for review in PACS.  Impression: Technically successful ultrasound guided injection.  Independent interpretation of notes and tests performed by another provider:   None.  Brief History, Exam, Impression, and Recommendations:    Trigger finger, right second and left fourth finger Failure of conservative treatment, we injected left fourth flexor tendon sheath. She still has some pain right hand but not enough to do an injection. Return to see me in 6 weeks.    ____________________________________________ Joselyn Nicely. Sandy Crumb, M.D., ABFM., CAQSM., AME. Primary Care and Sports Medicine [place] [facility]  Adjunct Professor of [facility] Medicine  [employer]  Restaurant manager, fast food

## 2023-09-23 NOTE — Addendum Note (Signed)
 Addended by: Gean Keels on: 09/23/2023 05:41 PM   Modules accepted: Level of Service

## 2023-11-04 ENCOUNTER — Encounter: Payer: Self-pay | Admitting: Sports Medicine

## 2023-11-04 ENCOUNTER — Other Ambulatory Visit: Payer: Self-pay

## 2023-11-04 ENCOUNTER — Ambulatory Visit (INDEPENDENT_AMBULATORY_CARE_PROVIDER_SITE_OTHER): Admitting: Sports Medicine

## 2023-11-04 DIAGNOSIS — M6530 Trigger finger, unspecified finger: Secondary | ICD-10-CM | POA: Diagnosis not present

## 2023-11-04 DIAGNOSIS — M65321 Trigger finger, right index finger: Secondary | ICD-10-CM

## 2023-11-04 MED ORDER — TRIAMCINOLONE ACETONIDE 40 MG/ML IJ SUSP
20.0000 mg | Freq: Once | INTRAMUSCULAR | Status: AC
  Administered 2023-11-04: 20 mg via INTRA_ARTICULAR

## 2023-11-04 NOTE — Assessment & Plan Note (Signed)
 Christina Wang returns, we injected her left fourth flexor tendon sheath about 6 weeks ago, she is having some discomfort in her right second flexor sheath, this was injected today, return as needed.

## 2023-11-04 NOTE — Progress Notes (Signed)
    Procedures performed today:    Procedure: Real-time Ultrasound Guided injection of the right second flexor tendon sheath Device: Samsung HS60  Verbal informed consent obtained.  Time-out conducted.  Noted no overlying erythema, induration, or other signs of local infection.  Skin prepped in a sterile fashion.  Local anesthesia: Topical Ethyl chloride.  With sterile technique and under real time ultrasound guidance: Noted flexor nodule, 0.5 cc lidocaine, 0.5 cc kenalog  40 injected easily. Advised to call if fevers/chills, erythema, induration, drainage, or persistent bleeding.  Images permanently stored and available for review in PACS.  Impression: Technically successful ultrasound guided injection.  Independent interpretation of notes and tests performed by another provider:   None.  Brief History, Exam, Impression, and Recommendations:    Trigger finger, right second and left fourth finger Ryelle returns, we injected her left fourth flexor tendon sheath about 6 weeks ago, she is having some discomfort in her right second flexor sheath, this was injected today, return as needed.    ____________________________________________ Debby PARAS. Curtis, M.D., ABFM., CAQSM., AME. Primary Care and Sports Medicine [place] [facility]  Adjunct Professor of [facility] Medicine  [employer]  Restaurant manager, fast food

## 2023-12-11 ENCOUNTER — Encounter: Payer: Self-pay | Admitting: Sports Medicine

## 2024-02-17 ENCOUNTER — Telehealth: Payer: Self-pay | Admitting: Family Medicine

## 2024-02-17 NOTE — Telephone Encounter (Signed)
 Spoke w/pt she has scheduled an appt w/Dr. ONEIDA

## 2024-02-17 NOTE — Telephone Encounter (Signed)
What is this asking

## 2024-02-17 NOTE — Telephone Encounter (Signed)
 Copied from CRM [id number]. Topic: Referral - Request for Referral >> Feb 17, 2024  2:36 PM Amy B wrote: Did the patient discuss referral with their provider in the last year? No (If No - schedule appointment) (If Yes - send message)  Appointment offered? No  Type of order/referral and detailed reason for visit: Orthopedics  Preference of office, provider, location: [place]  If referral order, have you been seen by this specialty before? Yes (If Yes, this issue or another issue? When? Where?  Can we respond through MyChart? No

## 2024-03-22 LAB — HM MAMMOGRAPHY

## 2024-03-23 ENCOUNTER — Ambulatory Visit: Payer: Self-pay | Admitting: Family Medicine

## 2024-03-23 ENCOUNTER — Encounter: Payer: Self-pay | Admitting: Family Medicine

## 2024-03-23 NOTE — Progress Notes (Signed)
 Please call patient. Normal mammogram.  Repeat in 1 year.
